# Patient Record
Sex: Male | Born: 1975 | Race: Black or African American | Hispanic: No | Marital: Married | Smoking: Current every day smoker
Health system: Southern US, Community
[De-identification: ages and names within clinical notes are randomized; demographics above are authoritative.]

## PROBLEM LIST (undated history)

## (undated) DIAGNOSIS — F1920 Other psychoactive substance dependence, uncomplicated: Secondary | ICD-10-CM

## (undated) DIAGNOSIS — K859 Acute pancreatitis without necrosis or infection, unspecified: Secondary | ICD-10-CM

## (undated) DIAGNOSIS — K259 Gastric ulcer, unspecified as acute or chronic, without hemorrhage or perforation: Secondary | ICD-10-CM

## (undated) DIAGNOSIS — F1010 Alcohol abuse, uncomplicated: Secondary | ICD-10-CM

---

## 2004-02-28 ENCOUNTER — Other Ambulatory Visit: Payer: Self-pay

## 2004-12-03 ENCOUNTER — Emergency Department: Payer: Self-pay | Admitting: General Practice

## 2008-02-29 ENCOUNTER — Other Ambulatory Visit: Payer: Self-pay

## 2008-02-29 ENCOUNTER — Emergency Department: Payer: Self-pay | Admitting: Internal Medicine

## 2009-01-07 ENCOUNTER — Emergency Department: Payer: Self-pay | Admitting: Unknown Physician Specialty

## 2009-05-02 ENCOUNTER — Emergency Department: Payer: Self-pay | Admitting: Emergency Medicine

## 2009-05-04 ENCOUNTER — Emergency Department: Payer: Self-pay | Admitting: Unknown Physician Specialty

## 2009-05-27 ENCOUNTER — Emergency Department: Payer: Self-pay | Admitting: Internal Medicine

## 2010-02-26 ENCOUNTER — Emergency Department: Payer: Self-pay | Admitting: Unknown Physician Specialty

## 2010-03-20 ENCOUNTER — Emergency Department: Payer: Self-pay | Admitting: Emergency Medicine

## 2010-04-18 ENCOUNTER — Observation Stay: Payer: Self-pay | Admitting: Internal Medicine

## 2012-12-30 ENCOUNTER — Emergency Department: Payer: Self-pay | Admitting: Unknown Physician Specialty

## 2012-12-30 LAB — CBC
HCT: 47.3 % (ref 40.0–52.0)
HGB: 16.2 g/dL (ref 13.0–18.0)
MCH: 32.2 pg (ref 26.0–34.0)
MCHC: 34.2 g/dL (ref 32.0–36.0)
MCV: 94 fL (ref 80–100)
Platelet: 259 10*3/uL (ref 150–440)
RDW: 12.8 % (ref 11.5–14.5)
WBC: 10.6 10*3/uL (ref 3.8–10.6)

## 2012-12-30 LAB — URINALYSIS, COMPLETE
Glucose,UR: NEGATIVE mg/dL (ref 0–75)
Ketone: NEGATIVE
Leukocyte Esterase: NEGATIVE
Nitrite: NEGATIVE
Ph: 5 (ref 4.5–8.0)
Protein: NEGATIVE
RBC,UR: <1 /HPF (ref 0–5)
Specific Gravity: 1.016 (ref 1.003–1.030)
Squamous Epithelial: NONE SEEN
WBC UR: 1 /HPF (ref 0–5)

## 2012-12-30 LAB — COMPREHENSIVE METABOLIC PANEL
Albumin: 3.4 g/dL (ref 3.4–5.0)
Alkaline Phosphatase: 103 U/L (ref 50–136)
Bilirubin,Total: 0.2 mg/dL (ref 0.2–1.0)
Calcium, Total: 8.1 mg/dL — ABNORMAL LOW (ref 8.5–10.1)
Chloride: 110 mmol/L — ABNORMAL HIGH (ref 98–107)
EGFR (African American): >60
EGFR (Non-African Amer.): >60
Glucose: 96 mg/dL (ref 65–99)
Osmolality: 280 (ref 275–301)
SGOT(AST): 26 U/L (ref 15–37)

## 2012-12-30 LAB — DRUG SCREEN, URINE
Amphetamines, Ur Screen: NEGATIVE (ref ?–1000)
Barbiturates, Ur Screen: NEGATIVE (ref ?–200)
Cannabinoid 50 Ng, Ur ~~LOC~~: NEGATIVE (ref ?–50)
MDMA (Ecstasy)Ur Screen: NEGATIVE (ref ?–500)
Opiate, Ur Screen: NEGATIVE (ref ?–300)
Phencyclidine (PCP) Ur S: NEGATIVE (ref ?–25)

## 2012-12-30 LAB — ETHANOL
Ethanol %: 0.139 % — ABNORMAL HIGH (ref 0.000–0.080)
Ethanol: 139 mg/dL

## 2012-12-30 LAB — TSH: Thyroid Stimulating Horm: 0.569 u[IU]/mL

## 2013-11-02 ENCOUNTER — Emergency Department: Payer: Self-pay | Admitting: Emergency Medicine

## 2013-11-10 ENCOUNTER — Emergency Department: Payer: Self-pay | Admitting: Emergency Medicine

## 2013-11-10 LAB — MONONUCLEOSIS SCREEN: Mono Test: NEGATIVE

## 2013-11-10 LAB — CBC
HCT: 47.9 % (ref 40.0–52.0)
HGB: 16.3 g/dL (ref 13.0–18.0)
MCH: 32.5 pg (ref 26.0–34.0)
MCHC: 34.1 g/dL (ref 32.0–36.0)
MCV: 95 fL (ref 80–100)
PLATELETS: 272 10*3/uL (ref 150–440)
RBC: 5.02 10*6/uL (ref 4.40–5.90)
RDW: 13.3 % (ref 11.5–14.5)
WBC: 11.7 10*3/uL — ABNORMAL HIGH (ref 3.8–10.6)

## 2013-11-14 LAB — BETA STREP CULTURE(ARMC)

## 2014-01-02 ENCOUNTER — Emergency Department: Payer: Self-pay | Admitting: Emergency Medicine

## 2014-04-14 ENCOUNTER — Emergency Department: Payer: Self-pay | Admitting: Emergency Medicine

## 2014-10-09 NOTE — Consult Note (Signed)
PATIENT NAME:  Parker ShihAPP, Panayiotis D MR#:  [id number] DATE OF BIRTH:  [birth year]  DATE OF CONSULTATION:  12/30/2012  CONSULTING PHYSICIAN:  Izola PriceFrances C. Jaclynn MajorGreason, MD  CHIEF COMPLAINT: "I need to get off this shit."   HISTORY OF PRESENT ILLNESS: Mr. Parker Thompson reports that came to the ED last night after a bout of using cocaine and drinking alcohol. He says that he has a job, but he gets paid at the end of every day and said he spends all of that on alcohol and crack cocaine. He reports that he generally spends about $100 on crack cocaine and drinks at least 6 40-ounce beers a day. He then goes on and says that maybe he does not drink beer every day and does not drink that much, but he drinks a lot of beer, too. He reports he is "tired of not having anything."   On interview, he is not suicidal or homicidal. There are no delusions. There is no psychosis. There are no signs or symptoms of withdrawal. He is rather demanding and loud.   He, in the course of his stay here, was offered to go to [facility] to enroll in their treatment program; however, he reports that he was there last year and left on his own after 2 days because he did not feel like he was getting much assistance there. He wants us to find him a place to go "for a long time."   ALCOHOL AND DRUG ABUSE: He reports that he first used alcohol when he was approximately [age] or 39 years old. His last use was 12/29/2012, and he reports that he probably drank about 6 40-ounce beers, but he is not sure. He reports that he uses alcohol 3 to 7 days a week. He denies any history of seizures or blackouts.   He reports that he also started using cocaine at about the same time, and his last use of cocaine was on 12/29/2012. He reports that he uses about $100 a day and uses most every day.   MENTAL STATUS EXAM: Mr. Parker Thompson is rather guarded in his presentation, and he also does not answer questions. He is accusatory and blames us for his inability to get what he deems "good care."  He denies any sleeping problems. His mood is variable and irritable and agitated. His thought processes are concrete. His memory appears to be intact, and he denies any auditory or visual hallucinations. There are no delusions. His  behavior is unremarkable. He is oriented x4. His speech is within normal limits. He has minimal insight and poor judgment.   SOCIAL HISTORY: He reports that he lives with his wife in a home, but he is not sure what she is going to do after this most recent bout of drug and alcohol use and spending his money on that. He denies any history of suicide attempts and denies any suicidal ideation, intent or plan presently. He claims that he was in and out of prison and has spent as much as 5 years secondary to drug charges. He denies any probation or troubles with the law. He denies any homicidal ideation or any history of having hurt anyone. He denies any plan to harm anyone.   FAMILY HISTORY: He denies any history of mental illness in his family. He reports that his family is supportive.   MEDICATIONS: None.   ALLERGIES: SHELLFISH, HE HAS RESPIRATORY DISTRESS IN RELATION  TO THAT.   DIAGNOSIS PRINCIPAL AND PRIMARY:  AXIS I:  1.  History of alcohol abuse. 2.  History of cocaine abuse.   AXIS II: Personality disorder, not otherwise specified.   AXIS III: None.   AXIS IV: Moderate.   AXIS V: 55 to 60%.  PLAN: Mr. Tung Pustejovsky Matteucci was offered to go to [facility] for treatment here in [place]; however, he said they were "useless," that he had been there before and after 2 days he left voluntarily. So, he was then offered a referral to [facility] which he accepted.  ____________________________ Izola Price. Jaclynn Major, MD fcg:cb D: 12/30/2012 15:52:10 ET T: 12/30/2012 16:42:31 ET JOB#: [id number]  cc: Izola Price. Jaclynn Major, MD, <Dictator> Maryan Puls MD ELECTRONICALLY SIGNED 01/01/2013 14:33

## 2015-03-01 ENCOUNTER — Emergency Department
Admission: EM | Admit: 2015-03-01 | Discharge: 2015-03-01 | Disposition: A | Discharge status: Home / Self Care | Payer: Self-pay | Attending: Emergency Medicine | Admitting: Emergency Medicine

## 2015-03-01 ENCOUNTER — Emergency Department: Payer: Self-pay

## 2015-03-01 ENCOUNTER — Encounter: Payer: Self-pay | Admitting: Emergency Medicine

## 2015-03-01 DIAGNOSIS — R1013 Epigastric pain: Secondary | ICD-10-CM | POA: Insufficient documentation

## 2015-03-01 DIAGNOSIS — Z720 Tobacco use: Secondary | ICD-10-CM | POA: Insufficient documentation

## 2015-03-01 DIAGNOSIS — R1012 Left upper quadrant pain: Secondary | ICD-10-CM | POA: Insufficient documentation

## 2015-03-01 DIAGNOSIS — R1011 Right upper quadrant pain: Secondary | ICD-10-CM | POA: Insufficient documentation

## 2015-03-01 DIAGNOSIS — R112 Nausea with vomiting, unspecified: Secondary | ICD-10-CM | POA: Insufficient documentation

## 2015-03-01 HISTORY — DX: Acute pancreatitis without necrosis or infection, unspecified: K85.90

## 2015-03-01 HISTORY — DX: Gastric ulcer, unspecified as acute or chronic, without hemorrhage or perforation: K25.9

## 2015-03-01 LAB — CBC WITH DIFFERENTIAL/PLATELET
BASOS ABS: 0.1 10*3/uL (ref 0–0.1)
Basophils Relative: 1 %
Eosinophils Absolute: 0.2 10*3/uL (ref 0–0.7)
Eosinophils Relative: 2 %
HEMATOCRIT: 48.5 % (ref 40.0–52.0)
Hemoglobin: 16.6 g/dL (ref 13.0–18.0)
LYMPHS PCT: 19 %
Lymphs Abs: 1.6 10*3/uL (ref 1.0–3.6)
MCH: 32.9 pg (ref 26.0–34.0)
MCHC: 34.1 g/dL (ref 32.0–36.0)
MCV: 96.5 fL (ref 80.0–100.0)
MONO ABS: 0.6 10*3/uL (ref 0.2–1.0)
Monocytes Relative: 7 %
NEUTROS ABS: 5.8 10*3/uL (ref 1.4–6.5)
NEUTROS PCT: 71 %
Platelets: 277 10*3/uL (ref 150–440)
RBC: 5.03 MIL/uL (ref 4.40–5.90)
RDW: 12.4 % (ref 11.5–14.5)
WBC: 8.3 10*3/uL (ref 3.8–10.6)

## 2015-03-01 LAB — COMPREHENSIVE METABOLIC PANEL
ALBUMIN: 3.7 g/dL (ref 3.5–5.0)
ALT: 16 U/L — AB (ref 17–63)
AST: 22 U/L (ref 15–41)
Alkaline Phosphatase: 78 U/L (ref 38–126)
Anion gap: 5 (ref 5–15)
BILIRUBIN TOTAL: 0.3 mg/dL (ref 0.3–1.2)
BUN: 9 mg/dL (ref 6–20)
CO2: 29 mmol/L (ref 22–32)
Calcium: 9.1 mg/dL (ref 8.9–10.3)
Chloride: 106 mmol/L (ref 101–111)
Creatinine, Ser: 1.25 mg/dL — ABNORMAL HIGH (ref 0.61–1.24)
GFR calc Af Amer: >60 mL/min (ref >60)
GFR calc non Af Amer: >60 mL/min (ref >60)
GLUCOSE: 105 mg/dL — AB (ref 65–99)
Potassium: 4 mmol/L (ref 3.5–5.1)
Sodium: 140 mmol/L (ref 135–145)
Total Protein: 6.7 g/dL (ref 6.5–8.1)

## 2015-03-01 LAB — LIPASE, BLOOD: Lipase: 34 U/L (ref 22–51)

## 2015-03-01 LAB — TROPONIN I

## 2015-03-01 MED ORDER — IOHEXOL 300 MG/ML  SOLN
100.0000 mL | Freq: Once | INTRAMUSCULAR | Status: AC | PRN
  Administered 2015-03-01: 100 mL via INTRAVENOUS
  Filled 2015-03-01: qty 100

## 2015-03-01 MED ORDER — SODIUM CHLORIDE 0.9 % IV BOLUS (SEPSIS)
1000.0000 mL | Freq: Once | INTRAVENOUS | Status: AC
Start: 2015-03-01 — End: 2015-03-01
  Administered 2015-03-01: 1000 mL via INTRAVENOUS

## 2015-03-01 MED ORDER — ONDANSETRON HCL 4 MG/2ML IJ SOLN
4.0000 mg | Freq: Once | INTRAMUSCULAR | Status: AC
  Administered 2015-03-01: 4 mg via INTRAVENOUS

## 2015-03-01 MED ORDER — DICYCLOMINE HCL 20 MG PO TABS
ORAL_TABLET | ORAL | Status: AC
  Administered 2015-03-01: 20 mg
  Filled 2015-03-01: qty 1

## 2015-03-01 MED ORDER — IOHEXOL 240 MG/ML SOLN
25.0000 mL | Freq: Once | INTRAMUSCULAR | Status: AC | PRN
  Administered 2015-03-01: 25 mL via INTRAVENOUS
  Filled 2015-03-01: qty 25

## 2015-03-01 MED ORDER — DICYCLOMINE HCL 10 MG PO CAPS
20.0000 mg | ORAL_CAPSULE | Freq: Once | ORAL | Status: DC
  Filled 2015-03-01: qty 2

## 2015-03-01 MED ORDER — OMEPRAZOLE 40 MG PO CPDR: 40.0000 mg | DELAYED_RELEASE_CAPSULE | Freq: Every day | ORAL | Status: DC

## 2015-03-01 MED ORDER — MORPHINE SULFATE (PF) 4 MG/ML IV SOLN
4.0000 mg | Freq: Once | INTRAVENOUS | Status: AC
Start: 2015-03-01 — End: 2015-03-01
  Administered 2015-03-01: 4 mg via INTRAVENOUS

## 2015-03-01 MED ORDER — DICYCLOMINE HCL 20 MG PO TABS: 20.0000 mg | ORAL_TABLET | Freq: Three times a day (TID) | ORAL | Status: DC | PRN

## 2015-03-01 MED ORDER — MORPHINE SULFATE (PF) 4 MG/ML IV SOLN
INTRAVENOUS | Status: AC
  Administered 2015-03-01: 4 mg via INTRAVENOUS
  Filled 2015-03-01: qty 1

## 2015-03-01 MED ORDER — ONDANSETRON HCL 4 MG/2ML IJ SOLN
INTRAMUSCULAR | Status: AC
  Filled 2015-03-01: qty 2

## 2015-03-01 MED ORDER — GI COCKTAIL ~~LOC~~
ORAL | Status: AC
  Administered 2015-03-01: 30 mL via ORAL
  Filled 2015-03-01: qty 30

## 2015-03-01 MED ORDER — GI COCKTAIL ~~LOC~~
30.0000 mL | Freq: Once | ORAL | Status: AC
  Administered 2015-03-01: 30 mL via ORAL

## 2015-03-01 NOTE — ED Notes (Signed)
Reports vomiting once or twice a day x 2 wks and noticed blood in it.  Reports pain abd all over and all over back pain.  Skin w/d.

## 2015-03-01 NOTE — ED Provider Notes (Addendum)
[facility] Emergency Department Provider Note  ____________________________________________  Time seen: Approximately 345 PM  I have reviewed the triage vital signs and the nursing notes.   HISTORY  Chief Complaint Emesis    HPI Parker Thompson is a 39 y.o. male with a history of pancreatitis and gastric ulcers who is presenting today with 1-2 weeks of nausea, vomiting as well as upper abdominal pain and back pain. He says that he has vomited once or twice per day with a small amount of blood streaks in his vomitus. He says the pain is intermittent and cramping. He has not had any blood in his stool and has had no issues stooling with his last bowel movement being small morning. He says that he has had this issue multiple times in the past. He does have a history of alcoholism and drinking 6-12 beers per day. He also admits to using crack cocaine with his last use being 3 days ago.   Past Medical History  Diagnosis Date  . Gastric ulcer   . Pancreatitis     There are no active problems to display for this patient.   History reviewed. No pertinent past surgical history.  No current outpatient prescriptions on file.  Allergies Shellfish allergy  History reviewed. No pertinent family history.  Social History Social History  Substance Use Topics  . Smoking status: Current Every Day Smoker  . Smokeless tobacco: None  . Alcohol Use: Yes    Review of Systems Constitutional: No fever/chills Eyes: No visual changes. ENT: No sore throat. Cardiovascular: Denies chest pain. Respiratory: Denies shortness of breath. Gastrointestinal: No diarrhea.  No constipation. Genitourinary: Negative for dysuria. Musculoskeletal: Negative for back pain. Skin: Negative for rash. Neurological: Negative for headaches, focal weakness or numbness.  10-point ROS otherwise negative.  ____________________________________________   PHYSICAL EXAM:  VITAL SIGNS: ED  Triage Vitals  Enc Vitals Group     BP 03/01/15 1153 118/83 mmHg     Pulse Rate 03/01/15 1153 77     Resp 03/01/15 1153 18     Temp 03/01/15 1153 98.5 F (36.9 C)     Temp Source 03/01/15 1153 Oral     SpO2 03/01/15 1153 98 %     Weight 03/01/15 1153 170 lb (77.111 kg)     Height 03/01/15 1153 5\' 8"  (1.727 m)     Head Cir --      Peak Flow --      Pain Score --      Pain Loc --      Pain Edu? --      Excl. in GC? --     Constitutional: Alert and oriented. Well appearing and in no acute distress. Eyes: Conjunctivae are normal. PERRL. EOMI. Head: Atraumatic. Nose: No congestion/rhinnorhea. Mouth/Throat: Mucous membranes are moist.  Oropharynx non-erythematous. Neck: No stridor.   Cardiovascular: Normal rate, regular rhythm. Grossly normal heart sounds.  Good peripheral circulation. Respiratory: Normal respiratory effort.  No retractions. Lungs CTAB. Gastrointestinal: Soft with moderate to severe epigastric tenderness. Mild tenderness to the right upper quadrant as well as left upper quadrant. There is a negative Murphy sign.. No distention. No abdominal bruits. No CVA tenderness. Musculoskeletal: No lower extremity tenderness nor edema.  No joint effusions. Neurologic:  Normal speech and language. No gross focal neurologic deficits are appreciated. No gait instability. Skin:  Skin is warm, dry and intact. No rash noted. Psychiatric: Mood and affect are normal. Speech and behavior are normal.  ____________________________________________  LABS (all labs ordered are listed, but only abnormal results are displayed)  Labs Reviewed  COMPREHENSIVE METABOLIC PANEL - Abnormal; Notable for the following:    Glucose, Bld 105 (*)    Creatinine, Ser 1.25 (*)    ALT 16 (*)    All other components within normal limits  CBC WITH DIFFERENTIAL/PLATELET  LIPASE, BLOOD  TROPONIN I   ____________________________________________  EKG ED ECG REPORT I, Arelia Longest, the attending  physician, personally viewed and interpreted this ECG.   Date: 03/01/2015  EKG Time: 1635  Rate: 67  Rhythm: normal sinus rhythm  Axis: Normal axis  Intervals:Incomplete right bundle branch block  ST&T Change: T-wave inversion in V2. Possibly related to lead placement. No ST elevations or depressions. ____________________________________________  RADIOLOGY  No acute pulmonary abnormality on the chest x-ray. A person reviewed these images. Normal study on the CAT scan of the abdomen and pelvis. ____________________________________________   PROCEDURES    ____________________________________________   INITIAL IMPRESSION / ASSESSMENT AND PLAN / ED COURSE  Pertinent labs & imaging results that were available during my care of the patient were reviewed by me and considered in my medical decision making (see chart for details).  ----------------------------------------- 4:38 PM on 03/01/2015 -----------------------------------------  Patient with pain improved after IV morphine. Was able tolerate by mouth contrast.  ----------------------------------------- 7:18 PM on 03/01/2015 ----------------------------------------- Patient requested food tray and ate the entire thing. Has been resting comfortably and not asking for any more dose of pain meds. However, when I went in to tell him that the rest of his lab results came back unremarkable he starts complaining of pain. I reassured him that his labs and imaging did not show any emergent pathology. However, I told him that he would need to follow-up with gastroenterologist and that this could be related to his ulcer pain. He will be discharged with a PPI as well as Bentyl. We discussed that he will likely need an endoscopy in the office.  ____________________________________________   FINAL CLINICAL IMPRESSION(S) / ED DIAGNOSES  Acute epigastric abdominal pain with nausea and vomiting. Initial visit.    Myrna Blazer, MD 03/01/15 1920  We discussed this likely being related to his alcohol consumption.  Myrna Blazer, MD 03/01/15 1922  Pt is PERC negative  Myrna Blazer, MD 03/01/15 [phone]

## 2015-03-01 NOTE — Discharge Instructions (Signed)
Abdominal Pain °Many things can cause abdominal pain. Usually, abdominal pain is not caused by a disease and will improve without treatment. It can often be observed and treated at home. Your health care provider will do a physical exam and possibly order blood tests and X-rays to help determine the seriousness of your pain. However, in many cases, more time must pass before a clear cause of the pain can be found. Before that point, your health care provider may not know if you need more testing or further treatment. °HOME CARE INSTRUCTIONS  °Monitor your abdominal pain for any changes. The following actions may help to alleviate any discomfort you are experiencing: °· Only take over-the-counter or prescription medicines as directed by your health care provider. °· Do not take laxatives unless directed to do so by your health care provider. °· Try a clear liquid diet (broth, tea, or water) as directed by your health care provider. Slowly move to a bland diet as tolerated. °SEEK MEDICAL CARE IF: °· You have unexplained abdominal pain. °· You have abdominal pain associated with nausea or diarrhea. °· You have pain when you urinate or have a bowel movement. °· You experience abdominal pain that wakes you in the night. °· You have abdominal pain that is worsened or improved by eating food. °· You have abdominal pain that is worsened with eating fatty foods. °· You have a fever. °SEEK IMMEDIATE MEDICAL CARE IF:  °· Your pain does not go away within 2 hours. °· You keep throwing up (vomiting). °· Your pain is felt only in portions of the abdomen, such as the right side or the left lower portion of the abdomen. °· You pass bloody or black tarry stools. °MAKE SURE YOU: °· Understand these instructions.   °· Will watch your condition.   °· Will get help right away if you are not doing well or get worse.   °Document Released: 03/15/2005 Document Revised: 06/10/2013 Document Reviewed: 02/12/2013 °ExitCare® Patient Information  ©2015 [employer]. This information is not intended to replace advice given to you by your health care provider. Make sure you discuss any questions you have with your health care provider. ° °Nausea and Vomiting °Nausea is a sick feeling that often comes before throwing up (vomiting). Vomiting is a reflex where stomach contents come out of your mouth. Vomiting can cause severe loss of body fluids (dehydration). Children and elderly adults can become dehydrated quickly, especially if they also have diarrhea. Nausea and vomiting are symptoms of a condition or disease. It is important to find the cause of your symptoms. °CAUSES  °· Direct irritation of the stomach lining. This irritation can result from increased acid production (gastroesophageal reflux disease), infection, food poisoning, taking certain medicines (such as nonsteroidal anti-inflammatory drugs), alcohol use, or tobacco use. °· Signals from the brain. These signals could be caused by a headache, heat exposure, an inner ear disturbance, increased pressure in the brain from injury, infection, a tumor, or a concussion, pain, emotional stimulus, or metabolic problems. °· An obstruction in the gastrointestinal tract (bowel obstruction). °· Illnesses such as diabetes, hepatitis, gallbladder problems, appendicitis, kidney problems, cancer, sepsis, atypical symptoms of a heart attack, or eating disorders. °· Medical treatments such as chemotherapy and radiation. °· Receiving medicine that makes you sleep (general anesthetic) during surgery. °DIAGNOSIS °Your caregiver may ask for tests to be done if the problems do not improve after a few days. Tests may also be done if symptoms are severe or if the reason for the nausea   and vomiting is not clear. Tests may include: °· Urine tests. °· Blood tests. °· Stool tests. °· Cultures (to look for evidence of infection). °· X-rays or other imaging studies. °Test results can help your caregiver make decisions about  treatment or the need for additional tests. °TREATMENT °You need to stay well hydrated. Drink frequently but in small amounts. You may wish to drink water, sports drinks, clear broth, or eat frozen ice pops or gelatin dessert to help stay hydrated. When you eat, eating slowly may help prevent nausea. There are also some antinausea medicines that may help prevent nausea. °HOME CARE INSTRUCTIONS  °· Take all medicine as directed by your caregiver. °· If you do not have an appetite, do not force yourself to eat. However, you must continue to drink fluids. °· If you have an appetite, eat a normal diet unless your caregiver tells you differently. °¨ Eat a variety of complex carbohydrates (rice, wheat, potatoes, bread), lean meats, yogurt, fruits, and vegetables. °¨ Avoid high-fat foods because they are more difficult to digest. °· Drink enough water and fluids to keep your urine clear or pale yellow. °· If you are dehydrated, ask your caregiver for specific rehydration instructions. Signs of dehydration may include: °¨ Severe thirst. °¨ Dry lips and mouth. °¨ Dizziness. °¨ Dark urine. °¨ Decreasing urine frequency and amount. °¨ Confusion. °¨ Rapid breathing or pulse. °SEEK IMMEDIATE MEDICAL CARE IF:  °· You have blood or brown flecks (like coffee grounds) in your vomit. °· You have black or bloody stools. °· You have a severe headache or stiff neck. °· You are confused. °· You have severe abdominal pain. °· You have chest pain or trouble breathing. °· You do not urinate at least once every 8 hours. °· You develop cold or clammy skin. °· You continue to vomit for longer than 24 to 48 hours. °· You have a fever. °MAKE SURE YOU:  °· Understand these instructions. °· Will watch your condition. °· Will get help right away if you are not doing well or get worse. °Document Released: 06/05/2005 Document Revised: 08/28/2011 Document Reviewed: 11/02/2010 °ExitCare® Patient Information ©2015 [employer]. This information is not  intended to replace advice given to you by your health care provider. Make sure you discuss any questions you have with your health care provider. ° °

## 2015-03-01 NOTE — ED Notes (Signed)
Attempted to call lab about Troponin results. Lab did not answer phones. Will call again soon.

## 2015-03-01 NOTE — ED Notes (Signed)
Called lab concerning trop, lab states "We are working on it now", will follow up

## 2015-03-01 NOTE — ED Notes (Signed)
Patient was agitated at discharge, stating he was in pain and he wasn't given anything for pain. Patient states he has been here before and "They say they can't find anything wrong." Patient denies following up with anyone else. Patient ate the ED sandwich tray. Patient refused to have IV site taped down after IV was removed.

## 2015-03-01 NOTE — ED Notes (Signed)
Pt states some nausea and vomiting with blood for the past 2 weeks, pt states lower abd pain around the umbilicus, states he drinks between a 6 pack and 12 pack of beer a night, denies any loose stools

## 2015-09-24 ENCOUNTER — Encounter: Payer: Self-pay | Admitting: Emergency Medicine

## 2015-09-24 ENCOUNTER — Emergency Department: Payer: Self-pay

## 2015-09-24 ENCOUNTER — Emergency Department
Admission: EM | Admit: 2015-09-24 | Discharge: 2015-09-24 | Disposition: A | Discharge status: Home / Self Care | Payer: Self-pay | Attending: Emergency Medicine | Admitting: Emergency Medicine

## 2015-09-24 DIAGNOSIS — Y999 Unspecified external cause status: Secondary | ICD-10-CM | POA: Insufficient documentation

## 2015-09-24 DIAGNOSIS — F17210 Nicotine dependence, cigarettes, uncomplicated: Secondary | ICD-10-CM | POA: Insufficient documentation

## 2015-09-24 DIAGNOSIS — S8391XA Sprain of unspecified site of right knee, initial encounter: Secondary | ICD-10-CM | POA: Insufficient documentation

## 2015-09-24 DIAGNOSIS — S80211A Abrasion, right knee, initial encounter: Secondary | ICD-10-CM

## 2015-09-24 DIAGNOSIS — S8001XA Contusion of right knee, initial encounter: Secondary | ICD-10-CM

## 2015-09-24 DIAGNOSIS — Y9355 Activity, bike riding: Secondary | ICD-10-CM | POA: Insufficient documentation

## 2015-09-24 DIAGNOSIS — Y92410 Unspecified street and highway as the place of occurrence of the external cause: Secondary | ICD-10-CM | POA: Insufficient documentation

## 2015-09-24 DIAGNOSIS — Z79899 Other long term (current) drug therapy: Secondary | ICD-10-CM | POA: Insufficient documentation

## 2015-09-24 DIAGNOSIS — K259 Gastric ulcer, unspecified as acute or chronic, without hemorrhage or perforation: Secondary | ICD-10-CM | POA: Insufficient documentation

## 2015-09-24 MED ORDER — NAPROXEN 500 MG PO TABS: 500.0000 mg | ORAL_TABLET | Freq: Two times a day (BID) | ORAL | Status: DC

## 2015-09-24 MED ORDER — KETOROLAC TROMETHAMINE 30 MG/ML IJ SOLN
30.0000 mg | Freq: Once | INTRAMUSCULAR | Status: AC
  Administered 2015-09-24: 30 mg via INTRAMUSCULAR
  Filled 2015-09-24: qty 1

## 2015-09-24 MED ORDER — HYDROCODONE-ACETAMINOPHEN 5-325 MG PO TABS: 1.0000 | ORAL_TABLET | Freq: Four times a day (QID) | ORAL | Status: DC | PRN
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
## 2015-09-24 NOTE — ED Notes (Signed)
Pt states the right knee is swollen.

## 2015-09-24 NOTE — Discharge Instructions (Signed)
Cryotherapy Cryotherapy is when you put ice on your injury. Ice helps lessen pain and puffiness (swelling) after an injury. Ice works the best when you start using it in the first 24 to 48 hours after an injury. HOME CARE  Put a dry or damp towel between the ice pack and your skin.  You may press gently on the ice pack.  Leave the ice on for no more than 10 to 20 minutes at a time.  Check your skin after 5 minutes to make sure your skin is okay.  Rest at least 20 minutes between ice pack uses.  Stop using ice when your skin loses feeling (numbness).  Do not use ice on someone who cannot tell you when it hurts. This includes small children and people with memory problems (dementia). GET HELP RIGHT AWAY IF:  You have white spots on your skin.  Your skin turns blue or pale.  Your skin feels waxy or hard.  Your puffiness gets worse. MAKE SURE YOU:   Understand these instructions.  Will watch your condition.  Will get help right away if you are not doing well or get worse.   This information is not intended to replace advice given to you by your health care provider. Make sure you discuss any questions you have with your health care provider.   Document Released: 11/22/2007 Document Revised: 08/28/2011 Document Reviewed: 01/26/2011 Elsevier Interactive Patient Education 2016 [employer]  Abrasion An abrasion is a cut or scrape on the surface of your skin. An abrasion does not go through all of the layers of your skin. It is important to take good care of your abrasion to prevent infection. HOME CARE Medicines  Take or apply medicines only as told by your doctor.  If you were prescribed an antibiotic ointment, finish all of it even if you start to feel better. Wound Care  Clean the wound with mild soap and water 2-3 times per day or as told by your doctor. Pat your wound dry with a clean towel. Do not rub it.  There are many ways to close and cover a wound. Follow  instructions from your doctor about:  How to take care of your wound.  When and how you should change your bandage (dressing).  When and how you should take off your dressing.  Check your wound every day for signs of infection. Watch for:  Redness, swelling, or pain.  Fluid, blood, or pus. General Instructions  Keep the dressing dry as told by your doctor. Do not take baths, swim, use a hot tub, or do anything that would put your wound underwater until your doctor says it is okay.  If there is swelling, raise (elevate) the injured area above the level of your heart while you are sitting or lying down.  Keep all follow-up visits as told by your doctor. This is important. GET HELP IF:  You were given a tetanus shot and you have any of these where the needle went in:  Swelling.  Very bad pain.  Redness.  Bleeding.  Medicine does not help your pain.  You have any of these at the site of the wound:  More redness.  More swelling.  More pain. GET HELP RIGHT AWAY IF:  You have a red streak going away from your wound.  You have a fever.  You have fluid, blood, or pus coming from your wound.  There is a bad smell coming from your wound.   This information  is not intended to replace advice given to you by your health care provider. Make sure you discuss any questions you have with your health care provider.   Document Released: 11/22/2007 Document Revised: 10/20/2014 Document Reviewed: 06/03/2014 Elsevier Interactive Patient Education 2016 [employer]   Knee Sprain A knee sprain is a tear in one of the strong, fibrous tissues that connect the bones (ligaments) in your knee. The severity of the sprain depends on how much of the ligament is torn. The tear can be either partial or complete. CAUSES  Often, sprains are a result of a fall or injury. The force of the impact causes the fibers of your ligament to stretch too much. This excess tension causes the fibers of  your ligament to tear. SIGNS AND SYMPTOMS  You may have some loss of motion in your knee. Other symptoms include:  Bruising.  Pain in the knee area.  Tenderness of the knee to the touch.  Swelling. DIAGNOSIS  To diagnose a knee sprain, your health care provider will physically examine your knee. Your health care provider may also suggest an X-ray exam of your knee to make sure no bones are broken. TREATMENT  If your ligament is only partially torn, treatment usually involves keeping the knee in a fixed position (immobilization) or bracing your knee for activities that require movement for several weeks. To do this, your health care provider will apply a bandage, cast, or splint to keep your knee from moving and to support your knee during movement until it heals. For a partially torn ligament, the healing process usually takes 4-6 weeks. If your ligament is completely torn, depending on which ligament it is, you may need surgery to reconnect the ligament to the bone or reconstruct it. After surgery, a cast or splint may be applied and will need to stay on your knee for 4-6 weeks while your ligament heals. HOME CARE INSTRUCTIONS  Keep your injured knee elevated to decrease swelling.  To ease pain and swelling, apply ice to the injured area:  Put ice in a plastic bag.  Place a towel between your skin and the bag.  Leave the ice on for 20 minutes, 2-3 times a day.  Only take medicine for pain as directed by your health care provider.  Do not leave your knee unprotected until pain and stiffness go away (usually 4-6 weeks).  If you have a cast or splint, do not allow it to get wet. If you have been instructed not to remove it, cover it with a plastic bag when you shower or bathe. Do not swim.  Your health care provider may suggest exercises for you to do during your recovery to prevent or limit permanent weakness and stiffness. SEEK IMMEDIATE MEDICAL CARE IF:  Your cast or splint  becomes damaged.  Your pain becomes worse.  You have significant pain, swelling, or numbness below the cast or splint. MAKE SURE YOU:  Understand these instructions.  Will watch your condition.  Will get help right away if you are not doing well or get worse.   This information is not intended to replace advice given to you by your health care provider. Make sure you discuss any questions you have with your health care provider.   Document Released: 06/05/2005 Document Revised: 06/26/2014 Document Reviewed: 01/15/2013 Elsevier Interactive Patient Education [employer]
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
## 2016-08-28 ENCOUNTER — Emergency Department
Admission: EM | Admit: 2016-08-28 | Discharge: 2016-08-28 | Disposition: A | Discharge status: Home / Self Care | Payer: Self-pay | Attending: Emergency Medicine | Admitting: Emergency Medicine

## 2016-08-28 DIAGNOSIS — F17210 Nicotine dependence, cigarettes, uncomplicated: Secondary | ICD-10-CM | POA: Insufficient documentation

## 2016-08-28 DIAGNOSIS — R220 Localized swelling, mass and lump, head: Secondary | ICD-10-CM

## 2016-08-28 DIAGNOSIS — K047 Periapical abscess without sinus: Secondary | ICD-10-CM | POA: Insufficient documentation

## 2016-08-28 MED ORDER — NAPROXEN 500 MG PO TABS: 500.0000 mg | ORAL_TABLET | Freq: Two times a day (BID) | ORAL | 0 refills | Status: DC

## 2016-08-28 MED ORDER — CLINDAMYCIN HCL 150 MG PO CAPS: 450.0000 mg | ORAL_CAPSULE | Freq: Three times a day (TID) | ORAL | 0 refills | Status: DC

## 2016-08-28 NOTE — ED Triage Notes (Signed)
Patient presents with left sided facial swelling. He noticed it yesterday but tonight woke up and it was doubled in size and more painful.

## 2016-08-28 NOTE — ED Provider Notes (Signed)
[facility] Emergency Department Provider Note  ____________________________________________  Time seen: Approximately 3:42 AM  I have reviewed the triage vital signs and the nursing notes.   HISTORY  Chief Complaint Facial Swelling    HPI Parker Thompson is a 41 y.o. male who complains of left-sided facial swelling starting yesterday. He's also had left upper dental pain for the past 2-3 days. No trauma. No fevers chills. No difficulty swallowing or shortness of breath. No neck pain or stiffness. No  vision changes or eye pain.     Past Medical History:  Diagnosis Date  . Gastric ulcer   . Pancreatitis      There are no active problems to display for this patient.    History reviewed. No pertinent surgical history.   Prior to Admission medications   Medication Sig Start Date End Date Taking? Authorizing Provider  clindamycin (CLEOCIN) 150 MG capsule Take 3 capsules (450 mg total) by mouth 3 (three) times daily. 08/28/16   Sharman CheekPhillip Harmon Bommarito, MD  dicyclomine (BENTYL) 20 MG tablet Take 1 tablet (20 mg total) by mouth 3 (three) times daily as needed for spasms. 03/01/15 02/29/16  Myrna Blazeravid Matthew Schaevitz, MD  HYDROcodone-acetaminophen (NORCO) 5-325 MG tablet Take 1 tablet by mouth every 6 (six) hours as needed for severe pain. 09/24/15   Jami L Hagler, PA-C  naproxen (NAPROSYN) 500 MG tablet Take 1 tablet (500 mg total) by mouth 2 (two) times daily with a meal. 08/28/16   Sharman CheekPhillip Mccoy Testa, MD  omeprazole (PRILOSEC) 40 MG capsule Take 1 capsule (40 mg total) by mouth daily. 03/01/15 02/29/16  Myrna Blazeravid Matthew Schaevitz, MD     Allergies Shellfish allergy   No family history on file.  Social History Social History  Substance Use Topics  . Smoking status: Current Every Day Smoker    Packs/day: 0.50    Types: Cigarettes  . Smokeless tobacco: Never Used  . Alcohol use Yes    Review of Systems  Constitutional:   No fever or chills.  ENT:   No sore  throat. No rhinorrhea. Cardiovascular:   No chest pain. Respiratory:   No dyspnea or cough. Gastrointestinal:   Negative for abdominal pain, vomiting and diarrhea.  Genitourinary:   Negative for dysuria or difficulty urinating. Musculoskeletal:   Negative for focal pain or swelling Neurological:   Negative for headaches 10-point ROS otherwise negative.  ____________________________________________   PHYSICAL EXAM:  VITAL SIGNS: ED Triage Vitals  Enc Vitals Group     BP 08/28/16 0147 (!) 147/83     Pulse Rate 08/28/16 0147 86     Resp 08/28/16 0147 20     Temp 08/28/16 0147 98.3 F (36.8 C)     Temp Source 08/28/16 0147 Oral     SpO2 08/28/16 0147 97 %     Weight 08/28/16 0141 170 lb (77.1 kg)     Height 08/28/16 0141 5\' 9"  (1.753 m)     Head Circumference --      Peak Flow --      Pain Score 08/28/16 0141 10     Pain Loc --      Pain Edu? --      Excl. in GC? --     Vital signs reviewed, nursing assessments reviewed.   Constitutional:   Alert and oriented. Well appearing and in no distress. Eyes:   No scleral icterus. No conjunctival pallor. PERRL. EOMI.  No nystagmus. ENT   Head:   Swelling of left maxilla. No bony  point tenderness. No crepitus. No contusions or ecchymosis..   Nose:   No congestion/rhinnorhea. No septal hematoma   Mouth/Throat:   MMM, no pharyngeal erythema. No peritonsillar mass. Poor dentition. Gingivitis left upper mandible without drainage or fluctuant fluid collection   Neck:   No stridor. No SubQ emphysema. No meningismus. Hematological/Lymphatic/Immunilogical:   No cervical lymphadenopathy. Cardiovascular:   RRR. Symmetric bilateral radial and DP pulses.  No murmurs.  Respiratory:   Normal respiratory effort without tachypnea nor retractions. Breath sounds are clear and equal bilaterally. No wheezes/rales/rhonchi. Gastrointestinal:   Soft and nontender. Non distended. There is no CVA tenderness.  No rebound, rigidity, or  guarding.  Musculoskeletal:   Normal range of motion in all extremities Neurologic: . Normal speech and language.  CN 2-10 normal. Motor grossly intact. No gross focal neurologic deficits are appreciated.  ____________________________________________    LABS (pertinent positives/negatives) (all labs ordered are listed, but only abnormal results are displayed) Labs Reviewed - No data to display ____________________________________________   EKG    ____________________________________________    RADIOLOGY  No results found.  ____________________________________________   PROCEDURES Procedures  ____________________________________________   INITIAL IMPRESSION / ASSESSMENT AND PLAN / ED COURSE  Pertinent labs & imaging results that were available during my care of the patient were reviewed by me and considered in my medical decision making (see chart for details).  Patient presents with dental pain and maxillary swelling on the left. Consistent with odontogenic infection. Referred to dentistry. Clindamycin. No evidence of orbital cellulitis and sinusitis brain abscess meningitis encephalitis. No history or evidence of trauma. Suitable for outpatient follow-up. Return precautions given.         ____________________________________________   FINAL CLINICAL IMPRESSION(S) / ED DIAGNOSES  Final diagnoses:  Facial swelling  Dental infection      New Prescriptions   CLINDAMYCIN (CLEOCIN) 150 MG CAPSULE    Take 3 capsules (450 mg total) by mouth 3 (three) times daily.   NAPROXEN (NAPROSYN) 500 MG TABLET    Take 1 tablet (500 mg total) by mouth 2 (two) times daily with a meal.     Portions of this note were generated with dragon dictation software. Dictation errors may occur despite best attempts at proofreading.    Sharman Cheek, MD 08/28/16 [phone]

## 2016-12-16 ENCOUNTER — Emergency Department
Admission: EM | Admit: 2016-12-16 | Discharge: 2016-12-17 | Disposition: A | Discharge status: Rehabilitation Facility | Payer: Self-pay | Attending: Emergency Medicine | Admitting: Emergency Medicine

## 2016-12-16 DIAGNOSIS — F1490 Cocaine use, unspecified, uncomplicated: Secondary | ICD-10-CM | POA: Insufficient documentation

## 2016-12-16 DIAGNOSIS — F17210 Nicotine dependence, cigarettes, uncomplicated: Secondary | ICD-10-CM | POA: Insufficient documentation

## 2016-12-16 DIAGNOSIS — F1020 Alcohol dependence, uncomplicated: Secondary | ICD-10-CM | POA: Insufficient documentation

## 2016-12-16 LAB — CBC
HCT: 50.1 % (ref 40.0–52.0)
Hemoglobin: 17.1 g/dL (ref 13.0–18.0)
MCH: 32.2 pg (ref 26.0–34.0)
MCHC: 34.2 g/dL (ref 32.0–36.0)
MCV: 94.1 fL (ref 80.0–100.0)
Platelets: 267 10*3/uL (ref 150–440)
RBC: 5.32 MIL/uL (ref 4.40–5.90)
RDW: 13.1 % (ref 11.5–14.5)
WBC: 12.1 10*3/uL — AB (ref 3.8–10.6)

## 2016-12-16 LAB — COMPREHENSIVE METABOLIC PANEL
ALK PHOS: 110 U/L (ref 38–126)
ALT: 42 U/L (ref 17–63)
ANION GAP: 10 (ref 5–15)
AST: 47 U/L — ABNORMAL HIGH (ref 15–41)
Albumin: 4.3 g/dL (ref 3.5–5.0)
BILIRUBIN TOTAL: 0.7 mg/dL (ref 0.3–1.2)
BUN: 12 mg/dL (ref 6–20)
CO2: 22 mmol/L (ref 22–32)
Calcium: 9.4 mg/dL (ref 8.9–10.3)
Chloride: 101 mmol/L (ref 101–111)
Creatinine, Ser: 1 mg/dL (ref 0.61–1.24)
GFR calc non Af Amer: >60 mL/min (ref >60)
Glucose, Bld: 76 mg/dL (ref 65–99)
POTASSIUM: 4.3 mmol/L (ref 3.5–5.1)
Sodium: 133 mmol/L — ABNORMAL LOW (ref 135–145)
TOTAL PROTEIN: 8 g/dL (ref 6.5–8.1)

## 2016-12-16 LAB — URINE DRUG SCREEN, QUALITATIVE (ARMC ONLY)
Amphetamines, Ur Screen: NOT DETECTED
BARBITURATES, UR SCREEN: NOT DETECTED
BENZODIAZEPINE, UR SCRN: NOT DETECTED
Cannabinoid 50 Ng, Ur ~~LOC~~: NOT DETECTED
Cocaine Metabolite,Ur ~~LOC~~: POSITIVE — AB
MDMA (Ecstasy)Ur Screen: NOT DETECTED
METHADONE SCREEN, URINE: NOT DETECTED
OPIATE, UR SCREEN: NOT DETECTED
PHENCYCLIDINE (PCP) UR S: NOT DETECTED
Tricyclic, Ur Screen: NOT DETECTED

## 2016-12-16 LAB — ETHANOL: ALCOHOL ETHYL (B): 19 mg/dL — AB (ref <5)

## 2016-12-16 MED ORDER — LORAZEPAM 2 MG/ML IJ SOLN
0.0000 mg | Freq: Four times a day (QID) | INTRAMUSCULAR | Status: DC
  Administered 2016-12-16: 2 mg via INTRAVENOUS

## 2016-12-16 MED ORDER — LORAZEPAM 2 MG PO TABS: 0.0000 mg | ORAL_TABLET | Freq: Two times a day (BID) | ORAL | Status: DC

## 2016-12-16 MED ORDER — THIAMINE HCL 100 MG/ML IJ SOLN: 100.0000 mg | Freq: Every day | INTRAMUSCULAR | Status: DC

## 2016-12-16 MED ORDER — LORAZEPAM 2 MG/ML IJ SOLN: 0.0000 mg | Freq: Two times a day (BID) | INTRAMUSCULAR | Status: DC

## 2016-12-16 MED ORDER — LORAZEPAM 1 MG PO TABS
ORAL_TABLET | ORAL | Status: AC
  Filled 2016-12-16: qty 2

## 2016-12-16 MED ORDER — LORAZEPAM 2 MG PO TABS: 0.0000 mg | ORAL_TABLET | Freq: Four times a day (QID) | ORAL | Status: DC

## 2016-12-16 MED ORDER — VITAMIN B-1 100 MG PO TABS: 100.0000 mg | ORAL_TABLET | Freq: Every day | ORAL | Status: DC

## 2016-12-16 NOTE — BH Assessment (Signed)
Writer discussed with the patient about the option of [facility].Patient was in the agreement with the plan. [facility] [address], [place], [place] [address]. [phone] He was accepted by intake worker Okey RegalCarol and was instructed to enter the front entrance.  The patients Dr and nurse are to be determined when he arrives.

## 2016-12-16 NOTE — ED Triage Notes (Signed)
Pt ambulatory to triage with no difficulty. Pt reports he has been drinking alcohol and using crack cocaine daily for about 6 months. Pt asking for hlep to get off both of these. Pt reports he drinks about 7-40 oz beers a day and has been using between 100 to 200 dollars of crack a day. Pt is calm and cooperative during triage and denies SI/HI, A/VH at this time.

## 2016-12-16 NOTE — ED Notes (Addendum)
Pt wanded for safety by [employer] officer and dressed out by male EMS student Aneta MinsPhillip with this RN present in the room Pt belongings of t shirt, shorts and flip flops placed in belongings bag, labeled and taken to lock up.

## 2016-12-16 NOTE — ED Provider Notes (Signed)
[facility] Emergency Department Provider Note  ____________________________________________   First MD Initiated Contact with Patient 12/16/16 2030     (approximate)  I have reviewed the triage vital signs and the nursing notes.   HISTORY  Chief Complaint Drug / Alcohol Assessment   HPI Parker Thompson is a 41 y.o. male with a history of pancreatitis who is presenting with polysubstance abuse. He says he has been drinking and using cocaine everyday for the past 6 months. However, he says that he is tired and wants help. He says that he has had 2 beers so far today and also has used cocaine today.  Patient says that he smokes cocaine and does not use any IV drugs. Denies any suicidal or homicidal ideation. Denies any audio or visual hallucinations. Patient denies feeling shaky at any time over the past 6 months when not drinking. Does not report any abdominal pain at this time.   Past Medical History:  Diagnosis Date  . Gastric ulcer   . Pancreatitis     There are no active problems to display for this patient.   No past surgical history on file.  Prior to Admission medications   Medication Sig Start Date End Date Taking? Authorizing Provider  clindamycin (CLEOCIN) 150 MG capsule Take 3 capsules (450 mg total) by mouth 3 (three) times daily. 08/28/16   Sharman CheekStafford, Phillip, MD  dicyclomine (BENTYL) 20 MG tablet Take 1 tablet (20 mg total) by mouth 3 (three) times daily as needed for spasms. 03/01/15 02/29/16  Myrna BlazerSchaevitz, Crystallee Werden Matthew, MD  HYDROcodone-acetaminophen (NORCO) 5-325 MG tablet Take 1 tablet by mouth every 6 (six) hours as needed for severe pain. 09/24/15   Hagler, Jami L, PA-C  naproxen (NAPROSYN) 500 MG tablet Take 1 tablet (500 mg total) by mouth 2 (two) times daily with a meal. 08/28/16   Sharman CheekStafford, Phillip, MD  omeprazole (PRILOSEC) 40 MG capsule Take 1 capsule (40 mg total) by mouth daily. 03/01/15 02/29/16  Gursimran Litaker, Myra Rudeavid Matthew, MD     Allergies Shellfish allergy  No family history on file.  Social History Social History  Substance Use Topics  . Smoking status: Current Every Day Smoker    Packs/day: 0.50    Types: Cigarettes  . Smokeless tobacco: Never Used  . Alcohol use Yes    Review of Systems  Constitutional: No fever/chills Eyes: No visual changes. ENT: No sore throat. Cardiovascular: Denies chest pain. Respiratory: Denies shortness of breath. Gastrointestinal: No abdominal pain.  No nausea, no vomiting.  No diarrhea.  No constipation. Genitourinary: Negative for dysuria. Musculoskeletal: Negative for back pain. Skin: Negative for rash. Neurological: Negative for headaches, focal weakness or numbness.   ____________________________________________   PHYSICAL EXAM:  VITAL SIGNS: ED Triage Vitals [12/16/16 1959]  Enc Vitals Group     BP 135/81     Pulse Rate (!) 102     Resp 18     Temp 99 F (37.2 C)     Temp Source Oral     SpO2 97 %     Weight 165 lb (74.8 kg)     Height 5\' 9"  (1.753 m)     Head Circumference      Peak Flow      Pain Score 0     Pain Loc      Pain Edu?      Excl. in GC?     Constitutional: Alert and oriented. Well appearing and in no acute distress. Eyes: Conjunctivae are normal.  Head: Atraumatic. Nose: No congestion/rhinnorhea. Mouth/Throat: Mucous membranes are moist.  Neck: No stridor.   Cardiovascular: Normal rate, regular rhythm. Grossly normal heart sounds.   Respiratory: Normal respiratory effort.  No retractions. Lungs CTAB. Gastrointestinal: Soft and nontender. No distention.  Musculoskeletal: No lower extremity tenderness nor edema.  No joint effusions. Neurologic:  Normal speech and language. No gross focal neurologic deficits are appreciated. Skin:  Skin is warm, dry and intact. No rash noted. Psychiatric: Mood and affect are normal. Speech and behavior are normal.  ____________________________________________   LABS (all labs ordered  are listed, but only abnormal results are displayed)  Labs Reviewed  COMPREHENSIVE METABOLIC PANEL - Abnormal; Notable for the following:       Result Value   Sodium 133 (*)    AST 47 (*)    All other components within normal limits  ETHANOL - Abnormal; Notable for the following:    Alcohol, Ethyl (B) 19 (*)    All other components within normal limits  CBC - Abnormal; Notable for the following:    WBC 12.1 (*)    All other components within normal limits  URINE DRUG SCREEN, QUALITATIVE ([facility] ONLY) - Abnormal; Notable for the following:    Cocaine Metabolite,Ur [facility] POSITIVE (*)    All other components within normal limits   ____________________________________________  EKG   ____________________________________________  RADIOLOGY   ____________________________________________   PROCEDURES  Procedure(s) performed:   Procedures  Critical Care performed:   ____________________________________________   INITIAL IMPRESSION / ASSESSMENT AND PLAN / ED COURSE  Pertinent labs & imaging results that were available during my care of the patient were reviewed by me and considered in my medical decision making (see chart for details).  Patient to be seen by behavioral health intake. We will place the patient on CIWA protocol.      ____________________________________________   FINAL CLINICAL IMPRESSION(S) / ED DIAGNOSES  Alcoholism. Cocaine use.    NEW MEDICATIONS STARTED DURING THIS VISIT:  New Prescriptions   No medications on file     Note:  This document was prepared using Dragon voice recognition software and may include unintentional dictation errors.     Myrna Blazer, MD 12/16/16 2103

## 2016-12-16 NOTE — ED Notes (Signed)
Pt brought in by [employer] officer voluntarily for help with drugs/alcohol; pt calm and cooperative; denies suicidal thoughts

## 2016-12-17 NOTE — ED Notes (Signed)
BEHAVIORAL HEALTH ROUNDING Patient sleeping: Yes.   Patient alert and oriented: not applicable SLEEPING Behavior appropriate: Yes.  ; If no, describe: SLEEPING Nutrition and fluids offered: No SLEEPING Toileting and hygiene offered: NoSLEEPING Sitter present: not applicable, Q 15 min safety rounds and observation. Law enforcement present: Yes ODS 

## 2016-12-17 NOTE — ED Notes (Signed)

## 2016-12-17 NOTE — ED Provider Notes (Signed)
-----------------------------------------   12:14 AM on 12/17/2016 -----------------------------------------  The patient has been accepted to [facility] [place].  I will discharge him so that he can be transported as per our private transportation group.  He is stable and in no acute distress.   Loleta RoseForbach, Naija Troost, MD 12/17/16 [phone]

## 2016-12-17 NOTE — Discharge Instructions (Signed)
You have been seen in the Emergency Department (ED) today for substance abuse.  You have been evaluated by the ED physician(s) and by the behavioral medicine specialists and are being discharged for transportation to [facility].  Please go directly to the facility.    Please return to the ED immediately if you have ANY thoughts of hurting yourself or anyone else, so that we may help you.  Please avoid alcohol and drug use.  Follow up with your doctor and/or therapist as soon as possible regarding today's ED  visit.   Please follow up any other recommendations and clinic appointments provided by the psychiatry team that saw you in the Emergency Department.

## 2017-07-13 ENCOUNTER — Other Ambulatory Visit: Payer: Self-pay

## 2017-07-13 ENCOUNTER — Emergency Department
Admission: EM | Admit: 2017-07-13 | Discharge: 2017-07-13 | Disposition: A | Discharge status: Home / Self Care | Payer: No Typology Code available for payment source | Attending: Emergency Medicine | Admitting: Emergency Medicine

## 2017-07-13 ENCOUNTER — Emergency Department: Payer: No Typology Code available for payment source

## 2017-07-13 ENCOUNTER — Encounter: Payer: Self-pay | Admitting: Emergency Medicine

## 2017-07-13 DIAGNOSIS — Z91013 Allergy to seafood: Secondary | ICD-10-CM | POA: Insufficient documentation

## 2017-07-13 DIAGNOSIS — Z79899 Other long term (current) drug therapy: Secondary | ICD-10-CM | POA: Diagnosis not present

## 2017-07-13 DIAGNOSIS — M542 Cervicalgia: Secondary | ICD-10-CM | POA: Insufficient documentation

## 2017-07-13 DIAGNOSIS — M549 Dorsalgia, unspecified: Secondary | ICD-10-CM | POA: Insufficient documentation

## 2017-07-13 DIAGNOSIS — F17210 Nicotine dependence, cigarettes, uncomplicated: Secondary | ICD-10-CM | POA: Diagnosis not present

## 2017-07-13 MED ORDER — CYCLOBENZAPRINE HCL 10 MG PO TABS: 10.0000 mg | ORAL_TABLET | Freq: Three times a day (TID) | ORAL | 0 refills | Status: AC | PRN

## 2017-07-13 MED ORDER — KETOROLAC TROMETHAMINE 30 MG/ML IJ SOLN
30.0000 mg | Freq: Once | INTRAMUSCULAR | Status: AC
  Administered 2017-07-13: 30 mg via INTRAMUSCULAR
  Filled 2017-07-13: qty 1

## 2017-07-13 MED ORDER — CYCLOBENZAPRINE HCL 10 MG PO TABS
10.0000 mg | ORAL_TABLET | Freq: Once | ORAL | Status: AC
  Administered 2017-07-13: 10 mg via ORAL
  Filled 2017-07-13: qty 1

## 2017-07-13 MED ORDER — MELOXICAM 15 MG PO TABS: 15.0000 mg | ORAL_TABLET | Freq: Every day | ORAL | 1 refills | Status: AC
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
## 2017-08-05 ENCOUNTER — Other Ambulatory Visit: Payer: Self-pay

## 2017-08-05 ENCOUNTER — Encounter: Payer: Self-pay | Admitting: Emergency Medicine

## 2017-08-05 ENCOUNTER — Emergency Department
Admission: EM | Admit: 2017-08-05 | Discharge: 2017-08-05 | Payer: Self-pay | Attending: Emergency Medicine | Admitting: Emergency Medicine

## 2017-08-05 DIAGNOSIS — F17210 Nicotine dependence, cigarettes, uncomplicated: Secondary | ICD-10-CM | POA: Insufficient documentation

## 2017-08-05 DIAGNOSIS — F1910 Other psychoactive substance abuse, uncomplicated: Secondary | ICD-10-CM | POA: Insufficient documentation

## 2017-08-05 DIAGNOSIS — F1010 Alcohol abuse, uncomplicated: Secondary | ICD-10-CM | POA: Insufficient documentation

## 2017-08-05 HISTORY — DX: Alcohol abuse, uncomplicated: F10.10

## 2017-08-05 HISTORY — DX: Other psychoactive substance dependence, uncomplicated: F19.20

## 2017-08-05 LAB — CBC WITH DIFFERENTIAL/PLATELET
BASOS ABS: 0 10*3/uL (ref 0–0.1)
BASOS PCT: 1 %
EOS ABS: 0.2 10*3/uL (ref 0–0.7)
Eosinophils Relative: 2 %
HEMATOCRIT: 49.8 % (ref 40.0–52.0)
HEMOGLOBIN: 17 g/dL (ref 13.0–18.0)
Lymphocytes Relative: 16 %
Lymphs Abs: 1.3 10*3/uL (ref 1.0–3.6)
MCH: 33.1 pg (ref 26.0–34.0)
MCHC: 34.1 g/dL (ref 32.0–36.0)
MCV: 96.9 fL (ref 80.0–100.0)
MONOS PCT: 8 %
Monocytes Absolute: 0.7 10*3/uL (ref 0.2–1.0)
NEUTROS ABS: 6.2 10*3/uL (ref 1.4–6.5)
NEUTROS PCT: 73 %
Platelets: 281 10*3/uL (ref 150–440)
RBC: 5.14 MIL/uL (ref 4.40–5.90)
RDW: 13.2 % (ref 11.5–14.5)
WBC: 8.5 10*3/uL (ref 3.8–10.6)

## 2017-08-05 LAB — URINE DRUG SCREEN, QUALITATIVE (ARMC ONLY)
AMPHETAMINES, UR SCREEN: NOT DETECTED
BARBITURATES, UR SCREEN: NOT DETECTED
Benzodiazepine, Ur Scrn: NOT DETECTED
COCAINE METABOLITE, UR ~~LOC~~: POSITIVE — AB
Cannabinoid 50 Ng, Ur ~~LOC~~: NOT DETECTED
MDMA (ECSTASY) UR SCREEN: NOT DETECTED
METHADONE SCREEN, URINE: NOT DETECTED
OPIATE, UR SCREEN: NOT DETECTED
Phencyclidine (PCP) Ur S: NOT DETECTED
TRICYCLIC, UR SCREEN: NOT DETECTED

## 2017-08-05 LAB — COMPREHENSIVE METABOLIC PANEL
ALT: 37 U/L (ref 17–63)
ANION GAP: 9 (ref 5–15)
AST: 52 U/L — ABNORMAL HIGH (ref 15–41)
Albumin: 3.9 g/dL (ref 3.5–5.0)
Alkaline Phosphatase: 84 U/L (ref 38–126)
BUN: 9 mg/dL (ref 6–20)
CALCIUM: 8.8 mg/dL — AB (ref 8.9–10.3)
CHLORIDE: 105 mmol/L (ref 101–111)
CO2: 23 mmol/L (ref 22–32)
CREATININE: 1.08 mg/dL (ref 0.61–1.24)
Glucose, Bld: 112 mg/dL — ABNORMAL HIGH (ref 65–99)
Potassium: 4.4 mmol/L (ref 3.5–5.1)
SODIUM: 137 mmol/L (ref 135–145)
Total Bilirubin: 0.9 mg/dL (ref 0.3–1.2)
Total Protein: 7.3 g/dL (ref 6.5–8.1)

## 2017-08-05 LAB — SALICYLATE LEVEL

## 2017-08-05 LAB — ACETAMINOPHEN LEVEL

## 2017-08-05 LAB — ETHANOL: Alcohol, Ethyl (B): <10 mg/dL (ref <10)

## 2017-08-05 NOTE — ED Notes (Signed)
[name] in with pt now

## 2017-08-05 NOTE — ED Provider Notes (Signed)
[facility] Emergency Department Provider Note   ____________________________________________   First MD Initiated Contact with Patient 08/05/17 1000     (approximate)  I have reviewed the triage vital signs and the nursing notes.   HISTORY  Chief Complaint Other (Detox)    HPI Parker Thompson is a 42 y.o. male patient reports he does alcohol and cocaine. He asked for detox. He says he doesn't want to go to [facility] because his right middle of the drug area and he does walk up the door and get cocaine. Last alcohol use was before he came in. He denies any other medical problems or complaints at this point.   Past Medical History:  Diagnosis Date  . Drug abuse and dependence (HCC)   . ETOH abuse   . Gastric ulcer   . Pancreatitis     There are no active problems to display for this patient.   History reviewed. No pertinent surgical history.  Prior to Admission medications   Medication Sig Start Date End Date Taking? Authorizing Provider  dicyclomine (BENTYL) 20 MG tablet Take 1 tablet (20 mg total) by mouth 3 (three) times daily as needed for spasms. Patient not taking: Reported on 08/05/2017 03/01/15 02/29/16  Myrna Blazer, MD  HYDROcodone-acetaminophen (NORCO) 5-325 MG tablet Take 1 tablet by mouth every 6 (six) hours as needed for severe pain. Patient not taking: Reported on 08/05/2017 09/24/15   Hagler, Jami L, PA-C  naproxen (NAPROSYN) 500 MG tablet Take 1 tablet (500 mg total) by mouth 2 (two) times daily with a meal. Patient not taking: Reported on 08/05/2017 08/28/16   Sharman Cheek, MD  omeprazole (PRILOSEC) 40 MG capsule Take 1 capsule (40 mg total) by mouth daily. Patient not taking: Reported on 08/05/2017 03/01/15 02/29/16  Schaevitz, Myra Rude, MD    Allergies Shellfish allergy  History reviewed. No pertinent family history.  Social History Social History   Tobacco Use  . Smoking status: Current Every Day Smoker   Packs/day: 0.50    Types: Cigarettes  . Smokeless tobacco: Never Used  Substance Use Topics  . Alcohol use: Yes  . Drug use: Yes    Types: Cocaine    review of systems Constitutional: No fever/chills Eyes: No visual changes. ENT: No sore throat. Cardiovascular: Denies chest pain. Respiratory: Denies shortness of breath. Gastrointestinal: No abdominal pain.  No nausea, no vomiting.  No diarrhea.  No constipation. Genitourinary: Negative for dysuria. Musculoskeletal: Negative for back pain. Skin: Negative for rash. Neurological: Negative for headaches, focal weakness or   ____________________________________________   PHYSICAL EXAM:  VITAL SIGNS: ED Triage Vitals  Enc Vitals Group     BP 08/05/17 0956 121/79     Pulse Rate 08/05/17 0956 90     Resp 08/05/17 0956 16     Temp 08/05/17 0956 98.8 F (37.1 C)     Temp Source 08/05/17 0956 Oral     SpO2 08/05/17 0956 95 %     Weight 08/05/17 0957 175 lb (79.4 kg)     Height 08/05/17 0957 5\' 9"  (1.753 m)     Head Circumference --      Peak Flow --      Pain Score 08/05/17 0957 0     Pain Loc --      Pain Edu? --      Excl. in GC? --     Constitutional: Alert and oriented. Well appearing and in no acute distress. Eyes: Conjunctivae are normal.  Head:  Atraumatic. Nose: No congestion/rhinnorhea. Mouth/Throat: Mucous membranes are moist.  Oropharynx non-erythematous. Neck: No stridor. Cardiovascular: Normal rate, regular rhythm. Grossly normal heart sounds.  Good peripheral circulation. Respiratory: Normal respiratory effort.  No retractions. Lungs CTAB. Gastrointestinal: Soft and nontender. No distention. No abdominal bruits. No CVA tenderness. {Musculoskeletal: No lower extremity tenderness nor edema.  No joint effusions. Neurologic:  Normal speech and language. No gross focal neurologic deficits are appreciated.    ____________________________________________   LABS (all labs ordered are listed, but only abnormal  results are displayed)  Labs Reviewed  COMPREHENSIVE METABOLIC PANEL - Abnormal; Notable for the following components:      Result Value   Glucose, Bld 112 (*)    Calcium 8.8 (*)    AST 52 (*)    All other components within normal limits  ACETAMINOPHEN LEVEL - Abnormal; Notable for the following components:   Acetaminophen (Tylenol), Serum <10 (*)    All other components within normal limits  URINE DRUG SCREEN, QUALITATIVE ([facility] ONLY) - Abnormal; Notable for the following components:   Cocaine Metabolite,[place] POSITIVE (*)    All other components within normal limits  SALICYLATE LEVEL  ETHANOL  CBC WITH DIFFERENTIAL/PLATELET   ____________________________________________  EKG   ____________________________________________  RADIOLOGY  ED MD interpretation:   Official radiology report(s): No results found.  ____________________________________________   PROCEDURES  Procedure(s) performed:   Procedures  Critical Care performed:  ____________________________________________   INITIAL IMPRESSION / ASSESSMENT AND PLAN / ED COURSE  we are attempting to find placement for this patient. He is not exhibiting any signs of withdrawal when I see him.         ____________________________________________   FINAL CLINICAL IMPRESSION(S) / ED DIAGNOSES  Final diagnoses:  Substance abuse [facility](HCC)     ED Discharge Orders    None       Note:  This document was prepared using Dragon voice recognition software and may include unintentional dictation errors.    Arnaldo NatalMalinda, Deaunna Olarte F, MD 08/05/17 [phone]

## 2017-08-05 NOTE — ED Notes (Signed)
[place] [facility]. Pt here for detox - asking for a place in [place]. Wants detox from crack and etoh. ciwas 1 on scale. Pt resting comfortably in his room. Ate a lunch tray.

## 2017-08-05 NOTE — ED Notes (Signed)
Pt given another food tray. Asking for iv to be taken out so was.

## 2017-08-05 NOTE — BH Assessment (Signed)
Assessment Note  Parker Thompson is an 42 y.o. male who presents to the ER seeking assistance for his substance use. Patient states he abuses alcohol, cocaine and cannabis. He states, for the last several months he has used on a daily basis. Due to his use, his relationships have become strained and problematic. Patient denies having a history of violence and aggression. He also denies any involvement with the DSS. He also denies having any SI/HI and AV/H.   Diagnosis: Alcohol, Cocaine & Cannabis Use Disorder; Severe   Past Medical History:  Past Medical History:  Diagnosis Date  . Drug abuse and dependence (HCC)   . ETOH abuse   . Gastric ulcer   . Pancreatitis     History reviewed. No pertinent surgical history.  Family History: History reviewed. No pertinent family history.  Social History:  reports that he has been smoking cigarettes.  He has been smoking about 0.50 packs per day. he has never used smokeless tobacco. He reports that he drinks alcohol. He reports that he uses drugs. Drug: Cocaine.  Additional Social History:  Alcohol / Drug Use Pain Medications: See PTA Prescriptions: See PTA Over the Counter: See PTA History of alcohol / drug use?: Yes Longest period of sobriety (when/how long): Unable to quantify Negative Consequences of Use: Personal relationships Withdrawal Symptoms: Nausea / Vomiting, Sweats, Tremors Substance #1 Name of Substance 1: Cocaine 1 - Age of First Use: 16 1 - Amount (size/oz): $100 1 - Frequency: Daily 1 - Duration: "Like this for two months" 1 - Last Use / Amount: 08/05/2017 Substance #2 Name of Substance 2: Alcohol 2 - Age of First Use: 7 2 - Amount (size/oz): "12-24 beers" (12oz) 2 - Frequency: Daily  2 - Duration: "This for two months" 2 - Last Use / Amount: 08/04/2017 Substance #3 Name of Substance 3: Cannabis 3 - Age of First Use: 15 3 - Amount (size/oz): Unable to quantify  3 - Frequency: "One to two times" 3 - Duration: Unable  to quantify 3 - Last Use / Amount: 08/03/2017  CIWA: CIWA-Ar BP: 121/79 Pulse Rate: 90 Nausea and Vomiting: no nausea and no vomiting Tactile Disturbances: none Tremor: no tremor Auditory Disturbances: not present Paroxysmal Sweats: no sweat visible Visual Disturbances: not present Anxiety: no anxiety, at ease Headache, Fullness in Head: very mild Agitation: normal activity Orientation and Clouding of Sensorium: oriented and can do serial additions CIWA-Ar Total: 1 COWS:    Allergies:  Allergies  Allergen Reactions  . Shellfish Allergy Swelling    Home Medications:  (Not in a hospital admission)  OB/GYN Status:  No LMP for male patient.  General Assessment Data Location of Assessment: [facility] ED TTS Assessment: In system Is this a Tele or Face-to-Face Assessment?: Face-to-Face Is this an Initial Assessment or a Re-assessment for this encounter?: Initial Assessment Marital status: Single Maiden name: n/a Is patient pregnant?: No Pregnancy Status: No Living Arrangements: Spouse/significant other Can pt return to current living arrangement?: Yes Admission Status: Voluntary Is patient capable of signing voluntary admission?: Yes Referral Source: Self/Family/Friend Insurance type: None  Medical Screening Exam [facility] Walk-in ONLY) Medical Exam completed: Yes  Crisis Care Plan Living Arrangements: Spouse/significant other Legal Guardian: Other:(Self) Name of Psychiatrist: Reports of none Name of Therapist: Reports of none  Education Status Is patient currently in school?: No Current Grade: n/a Highest grade of school patient has completed: 9th Grade Name of school: n/a Contact person: n/a  Risk to self with the past 6 months Suicidal  Ideation: No Has patient been a risk to self within the past 6 months prior to admission? : No Suicidal Intent: No Has patient had any suicidal intent within the past 6 months prior to admission? : No Is patient at risk for  suicide?: No Suicidal Plan?: No Has patient had any suicidal plan within the past 6 months prior to admission? : No Access to Means: No What has been your use of drugs/alcohol within the last 12 months?: Alcohol, Cocaine & Alcohol Previous Attempts/Gestures: No How many times?: 0 Other Self Harm Risks: Active Addiction Triggers for Past Attempts: None known Intentional Self Injurious Behavior: None Family Suicide History: No Recent stressful life event(s): Other (Comment) Persecutory voices/beliefs?: No Depression: Yes Depression Symptoms: Tearfulness, Guilt, Feeling worthless/self pity Substance abuse history and/or treatment for substance abuse?: Yes Suicide prevention information given to non-admitted patients: Not applicable  Risk to Others within the past 6 months Homicidal Ideation: No Does patient have any lifetime risk of violence toward others beyond the six months prior to admission? : No Thoughts of Harm to Others: No Current Homicidal Intent: No Current Homicidal Plan: No Access to Homicidal Means: No Identified Victim: Reports of none History of harm to others?: No Assessment of Violence: None Noted Violent Behavior Description: Reports of none Does patient have access to weapons?: No Criminal Charges Pending?: No Does patient have a court date: No Is patient on probation?: No  Psychosis Hallucinations: None noted Delusions: None noted  Mental Status Report Appearance/Hygiene: Unremarkable Eye Contact: Good Speech: Logical/coherent, Unremarkable Level of Consciousness: Alert Mood: Guilty, Sad, Pleasant Affect: Appropriate to circumstance, Sad Anxiety Level: None Thought Processes: Coherent, Relevant Judgement: Unimpaired Orientation: Person, Place, Time, Situation, Appropriate for developmental age Obsessive Compulsive Thoughts/Behaviors: Minimal  Cognitive Functioning Concentration: Normal Memory: Recent Intact, Remote Intact IQ: Average Insight:  Fair Impulse Control: Fair Appetite: Good Weight Loss: 0 Weight Gain: 0 Sleep: No Change Total Hours of Sleep: 7 Vegetative Symptoms: None  ADLScreening [facility] Assessment Services) Patient's cognitive ability adequate to safely complete daily activities?: Yes Patient able to express need for assistance with ADLs?: Yes Independently performs ADLs?: Yes (appropriate for developmental age)  Prior Inpatient Therapy Prior Inpatient Therapy: Yes Prior Therapy Dates: 2017 Prior Therapy Facilty/Provider(s): [facility] Reason for Treatment: Substance Abuse Treatment  Prior Outpatient Therapy Prior Outpatient Therapy: No Prior Therapy Dates: Reports of none Prior Therapy Facilty/Provider(s): Reports of none Reason for Treatment: Reports of none Does patient have an ACCT team?: No Does patient have Intensive In-House Services?  : No Does patient have [facility] services? : No Does patient have P4CC services?: No  ADL Screening (condition at time of admission) Patient's cognitive ability adequate to safely complete daily activities?: Yes Is the patient deaf or have difficulty hearing?: No Does the patient have difficulty seeing, even when wearing glasses/contacts?: No Does the patient have difficulty concentrating, remembering, or making decisions?: No Patient able to express need for assistance with ADLs?: Yes Does the patient have difficulty dressing or bathing?: No Independently performs ADLs?: Yes (appropriate for developmental age) Does the patient have difficulty walking or climbing stairs?: No Weakness of Legs: None Weakness of Arms/Hands: None  Home Assistive Devices/Equipment Home Assistive Devices/Equipment: None  Therapy Consults (therapy consults require a physician order) PT Evaluation Needed: No OT Evalulation Needed: No SLP Evaluation Needed: No Abuse/Neglect Assessment (Assessment to be complete while patient is alone) Abuse/Neglect Assessment Can Be Completed:  Yes Physical Abuse: Denies Verbal Abuse: Denies Sexual Abuse: Denies Exploitation of patient/patient's resources:  Denies Self-Neglect: Denies Values / Beliefs Cultural Requests During Hospitalization: None Spiritual Requests During Hospitalization: None Consults Spiritual Care Consult Needed: No Social Work Consult Needed: No Merchant navy officerAdvance Directives (For Healthcare) Does Patient Have a Medical Advance Directive?: No Would patient like information on creating a medical advance directive?: No - Patient declined    Additional Information 1:1 In Past 12 Months?: No CIRT Risk: No Elopement Risk: No Does patient have medical clearance?: Yes  Child/Adolescent Assessment Running Away Risk: Denies(Patient is an adult)  Disposition:  Disposition Initial Assessment Completed for this Encounter: Yes Disposition of Patient: Referred to Was patient referred to Social Work for Placement?: Yes Patient referred to: Other (Comment)(Detox Facility)  On Site Evaluation by:   Reviewed with Physician:    Lilyan Gilfordalvin J. Lenvil Swaim MS, LCAS, LPC, NCC, CCSI Therapeutic Triage Specialist 08/05/2017 2:03 PM

## 2017-08-05 NOTE — BH Assessment (Addendum)
Per the request of patient, writer faxed information to [facility]. Confirmed it was received Parker Thompson(Parker Thompson).  Writer spoke with [facility] [phone](Parker Thompson-[phone]) and patient accepted to go to transfer to their facility for detox.  ____________________________________ Patient has been accepted to [facility] Accepting physician is Dr. Sanjuana KavaPaula Sherman.  Call report to [phone].  Representative was [employer].   ER Staff is aware of it:  French Anaracy, ER Sect.;  Dr. Roxan Hockeyobinson, ER MD     Address: [facility] [address] [place], [place] [id number]

## 2017-08-05 NOTE — ED Triage Notes (Signed)
Pt in via POV, states he has a "drug and alcohol problem."  Pt reports using crack and drinking alcohol, last use last night.  Pt denies any SI/HI.  Vitals WDL, NAD noted at this time.

## 2017-08-05 NOTE — ED Notes (Signed)
EMTALA reviewed. 

## 2017-08-05 NOTE — ED Notes (Signed)
Patient given meal tray and beverage. 

## 2018-02-28 ENCOUNTER — Emergency Department: Payer: Self-pay

## 2018-02-28 ENCOUNTER — Inpatient Hospital Stay
Admission: EM | Admit: 2018-02-28 | Discharge: 2018-03-01 | DRG: 065 | Disposition: A | Discharge status: Home / Self Care | Payer: Self-pay | Attending: Specialist | Admitting: Specialist

## 2018-02-28 ENCOUNTER — Other Ambulatory Visit: Payer: Self-pay

## 2018-02-28 DIAGNOSIS — G8191 Hemiplegia, unspecified affecting right dominant side: Secondary | ICD-10-CM | POA: Diagnosis present

## 2018-02-28 DIAGNOSIS — F1490 Cocaine use, unspecified, uncomplicated: Secondary | ICD-10-CM | POA: Diagnosis present

## 2018-02-28 DIAGNOSIS — Z9889 Other specified postprocedural states: Secondary | ICD-10-CM

## 2018-02-28 DIAGNOSIS — I639 Cerebral infarction, unspecified: Secondary | ICD-10-CM | POA: Diagnosis present

## 2018-02-28 DIAGNOSIS — I6340 Cerebral infarction due to embolism of unspecified cerebral artery: Principal | ICD-10-CM | POA: Diagnosis present

## 2018-02-28 DIAGNOSIS — Z79899 Other long term (current) drug therapy: Secondary | ICD-10-CM

## 2018-02-28 DIAGNOSIS — F1020 Alcohol dependence, uncomplicated: Secondary | ICD-10-CM | POA: Diagnosis present

## 2018-02-28 DIAGNOSIS — E785 Hyperlipidemia, unspecified: Secondary | ICD-10-CM | POA: Diagnosis present

## 2018-02-28 DIAGNOSIS — Z7141 Alcohol abuse counseling and surveillance of alcoholic: Secondary | ICD-10-CM

## 2018-02-28 DIAGNOSIS — F17210 Nicotine dependence, cigarettes, uncomplicated: Secondary | ICD-10-CM | POA: Diagnosis present

## 2018-02-28 DIAGNOSIS — Z716 Tobacco abuse counseling: Secondary | ICD-10-CM

## 2018-02-28 DIAGNOSIS — Z8711 Personal history of peptic ulcer disease: Secondary | ICD-10-CM

## 2018-02-28 DIAGNOSIS — Z91013 Allergy to seafood: Secondary | ICD-10-CM

## 2018-02-28 LAB — COMPREHENSIVE METABOLIC PANEL
ALBUMIN: 4 g/dL (ref 3.5–5.0)
ALK PHOS: 85 U/L (ref 38–126)
ALT: 28 U/L (ref 0–44)
AST: 33 U/L (ref 15–41)
Anion gap: 8 (ref 5–15)
BUN: 9 mg/dL (ref 6–20)
CHLORIDE: 105 mmol/L (ref 98–111)
CO2: 23 mmol/L (ref 22–32)
Calcium: 8.9 mg/dL (ref 8.9–10.3)
Creatinine, Ser: 0.97 mg/dL (ref 0.61–1.24)
GFR calc Af Amer: >60 mL/min (ref >60)
GFR calc non Af Amer: >60 mL/min (ref >60)
Glucose, Bld: 91 mg/dL (ref 70–99)
POTASSIUM: 3.9 mmol/L (ref 3.5–5.1)
SODIUM: 136 mmol/L (ref 135–145)
TOTAL PROTEIN: 7.2 g/dL (ref 6.5–8.1)
Total Bilirubin: 0.4 mg/dL (ref 0.3–1.2)

## 2018-02-28 LAB — CBC
HCT: 48.1 % (ref 40.0–52.0)
Hemoglobin: 16.9 g/dL (ref 13.0–18.0)
MCH: 33.4 pg (ref 26.0–34.0)
MCHC: 35.1 g/dL (ref 32.0–36.0)
MCV: 95.2 fL (ref 80.0–100.0)
PLATELETS: 290 10*3/uL (ref 150–440)
RBC: 5.06 MIL/uL (ref 4.40–5.90)
RDW: 15 % — AB (ref 11.5–14.5)
WBC: 10.1 10*3/uL (ref 3.8–10.6)

## 2018-02-28 LAB — PROTIME-INR
INR: 0.88
PROTHROMBIN TIME: 11.9 s (ref 11.4–15.2)

## 2018-02-28 LAB — DIFFERENTIAL
BASOS PCT: 1 %
Basophils Absolute: 0.1 10*3/uL (ref 0–0.1)
EOS PCT: 2 %
Eosinophils Absolute: 0.2 10*3/uL (ref 0–0.7)
LYMPHS PCT: 21 %
Lymphs Abs: 2.1 10*3/uL (ref 1.0–3.6)
Monocytes Absolute: 0.6 10*3/uL (ref 0.2–1.0)
Monocytes Relative: 6 %
NEUTROS ABS: 7.1 10*3/uL — AB (ref 1.4–6.5)
NEUTROS PCT: 70 %

## 2018-02-28 LAB — APTT: aPTT: 25 seconds (ref 24–36)

## 2018-02-28 LAB — TROPONIN I

## 2018-02-28 MED ORDER — LORAZEPAM 1 MG PO TABS
0.0000 mg | ORAL_TABLET | Freq: Four times a day (QID) | ORAL | Status: DC
  Administered 2018-03-01: 1 mg via ORAL
  Filled 2018-02-28: qty 4
  Filled 2018-02-28: qty 1

## 2018-02-28 MED ORDER — LORAZEPAM 2 MG/ML IJ SOLN
1.0000 mg | Freq: Four times a day (QID) | INTRAMUSCULAR | Status: DC | PRN
  Administered 2018-03-01: 01:00:00 1 mg via INTRAVENOUS
  Filled 2018-02-28: qty 1

## 2018-02-28 MED ORDER — ENOXAPARIN SODIUM 40 MG/0.4ML ~~LOC~~ SOLN
40.0000 mg | SUBCUTANEOUS | Status: DC
  Administered 2018-02-28: 22:00:00 40 mg via SUBCUTANEOUS
  Filled 2018-02-28: qty 0.4

## 2018-02-28 MED ORDER — SENNOSIDES-DOCUSATE SODIUM 8.6-50 MG PO TABS: 1.0000 | ORAL_TABLET | Freq: Every evening | ORAL | Status: DC | PRN

## 2018-02-28 MED ORDER — ACETAMINOPHEN 650 MG RE SUPP: 650.0000 mg | RECTAL | Status: DC | PRN

## 2018-02-28 MED ORDER — ASPIRIN 81 MG PO CHEW
324.0000 mg | CHEWABLE_TABLET | Freq: Once | ORAL | Status: AC
  Administered 2018-02-28: 324 mg via ORAL
  Filled 2018-02-28: qty 4

## 2018-02-28 MED ORDER — THIAMINE HCL 100 MG/ML IJ SOLN
100.0000 mg | Freq: Every day | INTRAMUSCULAR | Status: DC
  Filled 2018-02-28 (×3): qty 1

## 2018-02-28 MED ORDER — ACETAMINOPHEN 160 MG/5ML PO SOLN
650.0000 mg | ORAL | Status: DC | PRN
  Filled 2018-02-28: qty 20.3

## 2018-02-28 MED ORDER — NICOTINE 21 MG/24HR TD PT24
21.0000 mg | MEDICATED_PATCH | Freq: Every day | TRANSDERMAL | Status: DC
  Filled 2018-02-28: qty 1

## 2018-02-28 MED ORDER — ADULT MULTIVITAMIN W/MINERALS CH
1.0000 | ORAL_TABLET | Freq: Every day | ORAL | Status: DC
  Administered 2018-02-28 – 2018-03-01 (×2): 1 via ORAL
  Filled 2018-02-28 (×2): qty 1

## 2018-02-28 MED ORDER — FOLIC ACID 1 MG PO TABS
1.0000 mg | ORAL_TABLET | Freq: Every day | ORAL | Status: DC
  Administered 2018-02-28 – 2018-03-01 (×2): 1 mg via ORAL
  Filled 2018-02-28 (×2): qty 1

## 2018-02-28 MED ORDER — STROKE: EARLY STAGES OF RECOVERY BOOK
Freq: Once | Status: AC
  Administered 2018-02-28: 22:00:00

## 2018-02-28 MED ORDER — LORAZEPAM 1 MG PO TABS: 0.0000 mg | ORAL_TABLET | Freq: Two times a day (BID) | ORAL | Status: DC

## 2018-02-28 MED ORDER — ACETAMINOPHEN 325 MG PO TABS
650.0000 mg | ORAL_TABLET | ORAL | Status: DC | PRN
  Administered 2018-02-28: 650 mg via ORAL
  Filled 2018-02-28: qty 2

## 2018-02-28 MED ORDER — ASPIRIN EC 325 MG PO TBEC
325.0000 mg | DELAYED_RELEASE_TABLET | Freq: Every day | ORAL | Status: DC
  Administered 2018-03-01: 12:00:00 325 mg via ORAL
  Filled 2018-02-28: qty 1

## 2018-02-28 MED ORDER — LORAZEPAM 1 MG PO TABS: 1.0000 mg | ORAL_TABLET | Freq: Four times a day (QID) | ORAL | Status: DC | PRN

## 2018-02-28 MED ORDER — VITAMIN B-1 100 MG PO TABS
100.0000 mg | ORAL_TABLET | Freq: Every day | ORAL | Status: DC
  Administered 2018-02-28 – 2018-03-01 (×2): 100 mg via ORAL
  Filled 2018-02-28 (×3): qty 1

## 2018-02-28 MED ORDER — SODIUM CHLORIDE 0.9 % IV SOLN
INTRAVENOUS | Status: DC
  Administered 2018-02-28 – 2018-03-01 (×2): via INTRAVENOUS

## 2018-02-28 NOTE — Plan of Care (Signed)

## 2018-02-28 NOTE — H&P (Signed)
[facility] [place] [facility]   PATIENT NAME: Parker PaymentJames Russett    MR#:  [id number]  DATE OF BIRTH:  [birth year]  DATE OF ADMISSION:  02/28/2018  PRIMARY CARE PHYSICIAN: Patient, No Pcp Per   REQUESTING/REFERRING PHYSICIAN: dr Fanny Bienquale  CHIEF COMPLAINT:    Right-sided numbness HISTORY OF PRESENT ILLNESS:  Parker Thompson  is a 42 y.o. male with a known history of tobacco dependence who presents with above complaint.  Patient reports over the past 3 days he has had right-sided numbness of his arm as well as face along with weakness in his right arm and right leg..  It lasted few minutes and goes away.  He has no other focal neurological deficits.  Initial CT of the head was negative for acute stroke however patient continues to have right-sided weakness.  PAST MEDICAL HISTORY:   Past Medical History:  Diagnosis Date  . Drug abuse and dependence (HCC)   . ETOH abuse   . Gastric ulcer   . Pancreatitis     PAST SURGICAL HISTORY:  None  SOCIAL HISTORY:   Social History   Tobacco Use  . Smoking status: Current Every Day Smoker    Packs/day: 0.50    Types: Cigarettes  . Smokeless tobacco: Never Used  Substance Use Topics  . Alcohol use: Yes    FAMILY HISTORY:  No history of CVA or CAD  DRUG ALLERGIES:   Allergies  Allergen Reactions  . Shellfish Allergy Swelling    REVIEW OF SYSTEMS:   Review of Systems  Constitutional: Negative.  Negative for chills, fever and malaise/fatigue.  HENT: Negative.  Negative for ear discharge, ear pain, hearing loss, nosebleeds and sore throat.   Eyes: Negative.  Negative for blurred vision and pain.  Respiratory: Negative.  Negative for cough, hemoptysis, shortness of breath and wheezing.   Cardiovascular: Negative.  Negative for chest pain, palpitations and leg swelling.  Gastrointestinal: Negative.  Negative for abdominal pain, blood in stool, diarrhea, nausea and vomiting.  Genitourinary: Negative.  Negative for  dysuria.  Musculoskeletal: Negative.  Negative for back pain.  Skin: Negative.   Neurological: Positive for sensory change and focal weakness. Negative for dizziness, tremors, speech change, seizures and headaches.  Endo/Heme/Allergies: Negative.  Does not bruise/bleed easily.  Psychiatric/Behavioral: Negative.  Negative for depression, hallucinations and suicidal ideas.    MEDICATIONS AT HOME:   Prior to Admission medications   Medication Sig Start Date End Date Taking? Authorizing Provider  dicyclomine (BENTYL) 20 MG tablet Take 1 tablet (20 mg total) by mouth 3 (three) times daily as needed for spasms. Patient not taking: Reported on 08/05/2017 03/01/15 02/29/16  Myrna BlazerSchaevitz, David Matthew, MD  HYDROcodone-acetaminophen (NORCO) 5-325 MG tablet Take 1 tablet by mouth every 6 (six) hours as needed for severe pain. Patient not taking: Reported on 08/05/2017 09/24/15   Hagler, Jami L, PA-C  naproxen (NAPROSYN) 500 MG tablet Take 1 tablet (500 mg total) by mouth 2 (two) times daily with a meal. Patient not taking: Reported on 08/05/2017 08/28/16   Sharman CheekStafford, Phillip, MD  omeprazole (PRILOSEC) 40 MG capsule Take 1 capsule (40 mg total) by mouth daily. Patient not taking: Reported on 08/05/2017 03/01/15 02/29/16  Myrna BlazerSchaevitz, David Matthew, MD      VITAL SIGNS:  Blood pressure 111/85, pulse 95, temperature 98.6 F (37 C), temperature source Oral, resp. rate 18, height 5\' 9"  (1.753 m), weight 78 kg, SpO2 95 %.  PHYSICAL EXAMINATION:   Physical Exam  Constitutional: He is oriented  to person, place, and time. No distress.  HENT:  Head: Normocephalic.  Eyes: No scleral icterus.  Neck: Normal range of motion. Neck supple. No JVD present. No tracheal deviation present.  Cardiovascular: Normal rate, regular rhythm and normal heart sounds. Exam reveals no gallop and no friction rub.  No murmur heard. Pulmonary/Chest: Effort normal and breath sounds normal. No respiratory distress. He has no wheezes. He has  no rales. He exhibits no tenderness.  Abdominal: Soft. Bowel sounds are normal. He exhibits no distension and no mass. There is no tenderness. There is no rebound and no guarding.  Musculoskeletal: Normal range of motion. He exhibits no edema.  Neurological: He is alert and oriented to person, place, and time.  Skin: Skin is warm. No rash noted. No erythema.  Psychiatric: Judgment normal.      LABORATORY PANEL:   CBC Recent Labs  Lab 02/28/18 1857  WBC 10.1  HGB 16.9  HCT 48.1  PLT 290   ------------------------------------------------------------------------------------------------------------------  Chemistries  Recent Labs  Lab 02/28/18 1857  NA 136  K 3.9  CL 105  CO2 23  GLUCOSE 91  BUN 9  CREATININE 0.97  CALCIUM 8.9  AST 33  ALT 28  ALKPHOS 85  BILITOT 0.4   ------------------------------------------------------------------------------------------------------------------  Cardiac Enzymes Recent Labs  Lab 02/28/18 1857  TROPONINI <0.03   ------------------------------------------------------------------------------------------------------------------  RADIOLOGY:  Ct Head Wo Contrast  Result Date: 02/28/2018 CLINICAL DATA:  Patient with right-sided body numbness, intermittent for 1 month. EXAM: CT HEAD WITHOUT CONTRAST TECHNIQUE: Contiguous axial images were obtained from the base of the skull through the vertex without intravenous contrast. COMPARISON:  None. FINDINGS: Brain: Ventricles and sulci are appropriate for patient's age. No evidence for acute cortically based infarct, intracranial hemorrhage, mass lesion or mass-effect. Vascular: Unremarkable. Skull: Intact. Sinuses/Orbits: Paranasal sinuses are well aerated. Mastoid air cells unremarkable. Other: None. IMPRESSION: No acute intracranial process. Electronically Signed   By: Annia Belt M.D.   On: 02/28/2018 19:38    EKG:  Normal sinus rhythm no ST elevation or depression  IMPRESSION AND PLAN:    42 year old male with tobacco dependence who presents with right-sided weakness.  1.  Acute CVA: CVA work-up including MRI/MRA, carotid Doppler and echocardiogram Aspirin 325 daily Consultation with neurology, PT, OT, speech Check lipid panel and A1c  2. Tobacco dependence: Patient is encouraged to quit smoking. Counseling was provided for 4 minutes.  3.  EtOH dependence: CIWA protocol initiated.   All the records are reviewed and case discussed with ED provider. Management plans discussed with the patient and he is in agreement  CODE STATUS: full  TOTAL TIME TAKING CARE OF THIS PATIENT: 45 minutes.    Benji Poynter M.D on 02/28/2018 at 8:26 PM  Between 7am to 6pm - Pager - [phone]  After 6pm go to [email or web address] - password [employer]  [facility]  Office  [phone]  CC: Primary care physician; Patient, No Pcp Per

## 2018-02-28 NOTE — ED Triage Notes (Signed)
Pt states R sided body numbness on and off x over a month. States twitching as well that comes and goes. Alert, oriented, ambulatory. Denies falls. Denies blood thinners. Speaking in complete sentences. No speech problems at this time. No vision changes.

## 2018-02-28 NOTE — ED Notes (Signed)
Pt states that he had a spell about three days ago where he felt numb on the right side of his body and started having some slurred speech. Pt states that the numbness in his face resolved but his right arm has still been numb.

## 2018-02-28 NOTE — ED Provider Notes (Signed)
[facility] Emergency Department Provider Note   ____________________________________________   First MD Initiated Contact with Patient 02/28/18 2008     (approximate)  I have reviewed the triage vital signs and the nursing notes.   HISTORY  Chief Complaint Numbness    HPI Parker Thompson is a 42 y.o. male here for evaluation of right-sided numbness  Patient reports for about a month now is noticed that he had a numb feeling over the right side of his body from the face to the foot.  3 days ago he also noticed that he felt like the right side of his face was not working in his face was drawing up on the left side, and he could use the right side of his face for about 3 or 4 minutes.  He also has noticed that he was able to push the lawn more well in his right arm and right leg seem weak for about the last 2 to 3 days.  Came today as he has not been getting any better.  Last known well was about 3 days ago.  No headache.  No nausea vomiting.  No fevers or chills.  Never had similar symptoms in the past.  Previous alcohol use.  No chest pain or trouble breathing.  Past Medical History:  Diagnosis Date  . Drug abuse and dependence (HCC)   . ETOH abuse   . Gastric ulcer   . Pancreatitis     Patient Active Problem List   Diagnosis Date Noted  . 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 No pertinent surgical history.  Prior to Admission medications   Medication Sig Start Date End Date Taking? Authorizing Provider  dicyclomine (BENTYL) 20 MG tablet Take 1 tablet (20 mg total) by mouth 3 (three) times daily as needed for spasms. Patient not taking: Reported on 08/05/2017 03/01/15 02/29/16  Myrna BlazerSchaevitz, David Matthew, MD  HYDROcodone-acetaminophen (NORCO) 5-325 MG tablet Take 1 tablet by mouth every 6 (six) hours as needed for severe pain. Patient not taking: Reported on 08/05/2017 09/24/15   Hagler, Jami L, PA-C  naproxen (NAPROSYN) 500 MG tablet  Take 1 tablet (500 mg total) by mouth 2 (two) times daily with a meal. Patient not taking: Reported on 08/05/2017 08/28/16   Sharman CheekStafford, Phillip, MD  omeprazole (PRILOSEC) 40 MG capsule Take 1 capsule (40 mg total) by mouth daily. Patient not taking: Reported on 08/05/2017 03/01/15 02/29/16  Schaevitz, Myra Rudeavid Matthew, MD    Allergies Shellfish allergy  History reviewed. No pertinent family history.  Social History Social History   Tobacco Use  . Smoking status: Current Every Day Smoker    Packs/day: 0.50    Types: Cigarettes  . Smokeless tobacco: Never Used  Substance Use Topics  . Alcohol use: Yes  . Drug use: Yes    Types: Cocaine    Review of Systems Constitutional: No fever/chills Eyes: No visual changes. ENT: No sore throat. Cardiovascular: Denies chest pain. Respiratory: Denies shortness of breath. Gastrointestinal: No abdominal pain.  No nausea, no vomiting.  No diarrhea.  No constipation. Genitourinary: Negative for dysuria. Musculoskeletal: Negative for back pain. Skin: Negative for rash. Neurological: Negative for neck pain.  Numbness over the right side.  Weakness in the right arm and right leg.  Right side of the face 1 work 3 days ago.    ____________________________________________   PHYSICAL EXAM:  VITAL SIGNS: ED Triage Vitals  Enc Vitals Group     BP 02/28/18 1854 111/85  Pulse Rate 02/28/18 1854 95     Resp 02/28/18 1854 18     Temp 02/28/18 1854 98.6 F (37 C)     Temp Source 02/28/18 1854 Oral     SpO2 02/28/18 1854 95 %     Weight 02/28/18 1853 172 lb (78 kg)     Height 02/28/18 1853 5\' 9"  (1.753 m)     Head Circumference --      Peak Flow --      Pain Score 02/28/18 1853 0     Pain Loc --      Pain Edu? --      Excl. in GC? --     Constitutional: Alert and oriented. Well appearing and in no acute distress. Eyes: Conjunctivae are normal. Head: Atraumatic. Nose: No congestion/rhinnorhea. Mouth/Throat: Mucous membranes are  moist. Neck: No stridor.   Cardiovascular: Normal rate, regular rhythm. Grossly normal heart sounds.  Good peripheral circulation. Respiratory: Normal respiratory effort.  No retractions. Lungs CTAB. Gastrointestinal: Soft and nontender. No distention. Musculoskeletal: No lower extremity tenderness nor edema. Neurologic:  Normal speech and language.  Numbness right face right arm right leg.  Weakness about 4+ right arm versus left which is normal.  There is no facial droop evident today.  There is pronator drift involving the right upper extremity.  Left-sided extremities normal and strength.  Right upper right lower extremity slightly reduced strength.  There is focal findings primarily only over the right side.  Skin:  Skin is warm, dry and intact. No rash noted. Psychiatric: Mood and affect are normal. Speech and behavior are normal.  ____________________________________________   LABS (all labs ordered are listed, but only abnormal results are displayed)  Labs Reviewed  CBC - Abnormal; Notable for the following components:      Result Value   RDW 15.0 (*)    All other components within normal limits  DIFFERENTIAL - Abnormal; Notable for the following components:   Neutro Abs 7.1 (*)    All other components within normal limits  PROTIME-INR  APTT  COMPREHENSIVE METABOLIC PANEL  TROPONIN I  CBG MONITORING, ED   ____________________________________________  EKG  Reviewed and entered by me at 1900 Heart rate 90 QRS 95 QTc 440 Normal sinus rhythm, no evidence of acute ischemia. ____________________________________________  RADIOLOGY  CT of the head today reviewed is normal ____________________________________________   PROCEDURES  Procedure(s) performed: None  Procedures  Critical Care performed: No  ____________________________________________   INITIAL IMPRESSION / ASSESSMENT AND PLAN / ED COURSE  Pertinent labs & imaging results that were available during  my care of the patient were reviewed by me and considered in my medical decision making (see chart for details).  Right-sided focal neurologic deficits.  Clinical history with the drying of his face 3 days ago and now persistent right-sided arm weakness numbness and pronator drift highly suggestive of an acute stroke.  CT of the head negative for acute stroke, well outside of any TPA window and not a interventional candidate as his symptoms started 2 to 3 days ago. VAN negative.  Discussed with patient, will admit for further work-up.  Patient in agreement and understanding of plan.  Discussed with Dr. Juliene Pina of the hospitalist service.  Administer salicylates now and anticipate further evaluation including MRI and neurology consult moving forward.     ____________________________________________   FINAL CLINICAL IMPRESSION(S) / ED DIAGNOSES  Final diagnoses:  Cerebrovascular accident (CVA), unspecified mechanism (HCC)      NEW MEDICATIONS STARTED DURING THIS  VISIT:  New Prescriptions   No medications on file     Note:  This document was prepared using Dragon voice recognition software and may include unintentional dictation errors.     Sharyn Creamer, MD 02/28/18 2025

## 2018-03-01 ENCOUNTER — Inpatient Hospital Stay: Payer: Self-pay

## 2018-03-01 ENCOUNTER — Inpatient Hospital Stay
Admit: 2018-03-01 | Discharge: 2018-03-01 | Disposition: A | Discharge status: Home / Self Care | Payer: Self-pay | Attending: Internal Medicine | Admitting: Internal Medicine

## 2018-03-01 ENCOUNTER — Other Ambulatory Visit: Payer: Self-pay

## 2018-03-01 ENCOUNTER — Encounter: Payer: Self-pay | Admitting: Nurse Practitioner

## 2018-03-01 DIAGNOSIS — I639 Cerebral infarction, unspecified: Secondary | ICD-10-CM

## 2018-03-01 LAB — ECHOCARDIOGRAM COMPLETE
Height: 69 in
Weight: 2752 oz

## 2018-03-01 LAB — LIPID PANEL
CHOL/HDL RATIO: 2.8 ratio
CHOLESTEROL: 187 mg/dL (ref 0–200)
HDL: 66 mg/dL (ref >40)
LDL Cholesterol: 102 mg/dL — ABNORMAL HIGH (ref 0–99)
TRIGLYCERIDES: 95 mg/dL (ref <150)
VLDL: 19 mg/dL (ref 0–40)

## 2018-03-01 LAB — HEMOGLOBIN A1C
Hgb A1c MFr Bld: 5.4 % (ref 4.8–5.6)
Mean Plasma Glucose: 108.28 mg/dL

## 2018-03-01 MED ORDER — IOHEXOL 350 MG/ML SOLN
75.0000 mL | Freq: Once | INTRAVENOUS | Status: AC | PRN
  Administered 2018-03-01: 75 mL via INTRAVENOUS

## 2018-03-01 MED ORDER — ASPIRIN EC 81 MG PO TBEC: 81.0000 mg | DELAYED_RELEASE_TABLET | Freq: Every day | ORAL | 1 refills | Status: AC

## 2018-03-01 MED ORDER — PRAVASTATIN SODIUM 40 MG PO TABS: 40.0000 mg | ORAL_TABLET | Freq: Every day | ORAL | 1 refills | Status: DC

## 2018-03-01 NOTE — Consult Note (Addendum)
Referring Physician: Adrian Saran, MD    Chief Complaint: Right side weakness and numbness  HPI: Parker Thompson is an 42 y.o. male with past medical history of drug abuse and dependence, alcohol abuse, gastric ulcers, pancreatitis, presenting with intermittent right-sided facial and leg numbness and weakness.  Patient reports that he has had these symptoms on and off in the last 1 to 2 months.  He states that he was at work at one point and his whole right side became numb in his mouth began twitching to the right.  Symptoms lasted a few seconds and then resolved without any focal deficits.  He presented to the ED yesterday due to worsening symptoms associated with a headache.  He reports that he has been smoking cigarettes about half a pack per day drinks alcohol 2 standard drinks per week.  He admits  to using cocaine occasionally, last time he used cocaine was yesterday.  He noticed that symptoms came on soon after he used the cocaine.  He denies other associated symptoms of visual and speech disturbance, gait difficulty, or other associated focal neurologic deficits.  Initial CT head did not show acute intracranial abnormality, however follow-up MRI brain showed stroke within the left frontal and parietal lobes.  There he was therefore admitted for further stroke work-up. Initial NIHSS of 1.  Date last known well: Unable to determine Time last known well: Unable to determine tPA Given: No: Outside of window period.  Past Medical History:  Diagnosis Date  . Drug abuse and dependence (HCC)   . ETOH abuse   . Gastric ulcer   . Pancreatitis     History reviewed. No pertinent surgical history.  Family History  Problem Relation Age of Onset  . Hypertension Mother   . Kidney failure Brother    Social History:  reports that he has been smoking cigarettes. He has been smoking about 0.50 packs per day. He has never used smokeless tobacco. He reports that he drinks about 2.0 standard drinks of  alcohol per week. He reports that he has current or past drug history. Drug: Cocaine.  Allergies:  Allergies  Allergen Reactions  . Shellfish Allergy Swelling    Medications:  I have reviewed the patient's current medications. Prior to Admission:  Medications Prior to Admission  Medication Sig Dispense Refill Last Dose  . dicyclomine (BENTYL) 20 MG tablet Take 1 tablet (20 mg total) by mouth 3 (three) times daily as needed for spasms. (Patient not taking: Reported on 08/05/2017) 30 tablet 0 Not Taking at Unknown time  . HYDROcodone-acetaminophen (NORCO) 5-325 MG tablet Take 1 tablet by mouth every 6 (six) hours as needed for severe pain. (Patient not taking: Reported on 08/05/2017) 6 tablet 0 Not Taking at Unknown time  . naproxen (NAPROSYN) 500 MG tablet Take 1 tablet (500 mg total) by mouth 2 (two) times daily with a meal. (Patient not taking: Reported on 08/05/2017) 20 tablet 0 Not Taking at Unknown time  . omeprazole (PRILOSEC) 40 MG capsule Take 1 capsule (40 mg total) by mouth daily. (Patient not taking: Reported on 08/05/2017) 30 capsule 1 Not Taking at Unknown time   Scheduled: . aspirin EC  325 mg Oral Daily  . enoxaparin (LOVENOX) injection  40 mg Subcutaneous Q24H  . folic acid  1 mg Oral Daily  . LORazepam  0-4 mg Oral Q6H   Followed by  . [START ON 03/02/2018] LORazepam  0-4 mg Oral Q12H  . multivitamin with minerals  1 tablet Oral  Daily  . nicotine  21 mg Transdermal Daily  . thiamine  100 mg Oral Daily   Or  . thiamine  100 mg Intravenous Daily    ROS: History obtained from the patient   General ROS: negative for - chills, fatigue, fever, night sweats, weight gain or weight loss Psychological ROS: negative for - behavioral disorder, hallucinations, memory difficulties, mood swings or suicidal ideation Ophthalmic ROS: negative for - blurry vision, double vision, eye pain or loss of vision ENT ROS: negative for - epistaxis, nasal discharge, oral lesions, sore throat,  tinnitus or vertigo Allergy and Immunology ROS: negative for - hives or itchy/watery eyes Hematological and Lymphatic ROS: negative for - bleeding problems, bruising or swollen lymph nodes Endocrine ROS: negative for - galactorrhea, hair pattern changes, polydipsia/polyuria or temperature intolerance Respiratory ROS: negative for - cough, hemoptysis, shortness of breath or wheezing Cardiovascular ROS: negative for - chest pain, dyspnea on exertion, edema or irregular heartbeat Gastrointestinal ROS: negative for - abdominal pain, diarrhea, hematemesis, nausea/vomiting or stool incontinence Genito-Urinary ROS: negative for - dysuria, hematuria, incontinence or urinary frequency/urgency Musculoskeletal ROS: negative for - joint swelling or muscular weakness Neurological ROS: as noted in HPI Dermatological ROS: negative for rash and skin lesion changes  Physical Examination: Blood pressure 126/81, pulse 79, temperature 98.2 F (36.8 C), temperature source Oral, resp. rate 18, height 5\' 9"  (1.753 m), weight 78 kg, SpO2 95 %.  Neurological Exam   Mental Status: Alert, oriented, thought content appropriate.  Speech fluent without evidence of aphasia.  Able to follow 3 step commands without difficulty. Attention span and concentration seemed appropriate  Cranial Nerves: II: Discs flat bilaterally; Visual fields grossly normal, pupils equal, round, reactive to light and accommodation III,IV, VI: ptosis not present, extra-ocular motions intact bilaterally V,VII: smile symmetric, facial light touch sensation decreased on the right VIII: hearing normal bilaterally IX,X: gag reflex present XI: bilateral shoulder shrug XII: midline tongue extension Motor: Right :  Upper extremity   4/5 Without pronator drift      Left: Upper extremity   5/5 without pronator drift Right:   Lower extremity   4+/5                                       Left: Lower extremity   5/5 Tone and bulk:normal tone throughout; no  atrophy noted Sensory: Pinprick and light touch decreased on the right upper>lower Deep Tendon Reflexes: 2+ and symmetric throughout Plantars: Right: mute                              Left: mute Cerebellar: Finger-to-nose testing intact on the left, dysmetric on the right. Heel to shin testing normal bilaterally Gait: not tested due to safety concerns  Data Reviewed  Laboratory Studies:  Basic Metabolic Panel: Recent Labs  Lab 02/28/18 1857  NA 136  K 3.9  CL 105  CO2 23  GLUCOSE 91  BUN 9  CREATININE 0.97  CALCIUM 8.9    Liver Function Tests: Recent Labs  Lab 02/28/18 1857  AST 33  ALT 28  ALKPHOS 85  BILITOT 0.4  PROT 7.2  ALBUMIN 4.0   No results for input(s): LIPASE, AMYLASE in the last 168 hours. No results for input(s): AMMONIA in the last 168 hours.  CBC: Recent Labs  Lab 02/28/18 1857  WBC 10.1  NEUTROABS  7.1*  HGB 16.9  HCT 48.1  MCV 95.2  PLT 290    Cardiac Enzymes: Recent Labs  Lab 02/28/18 1857  TROPONINI <0.03    BNP: Invalid input(s): POCBNP  CBG: No results for input(s): GLUCAP in the last 168 hours.  Microbiology: No results found for this or any previous visit.  Coagulation Studies: Recent Labs    02/28/18 1857  LABPROT 11.9  INR 0.88    Urinalysis: No results for input(s): COLORURINE, LABSPEC, PHURINE, GLUCOSEU, HGBUR, BILIRUBINUR, KETONESUR, PROTEINUR, UROBILINOGEN, NITRITE, LEUKOCYTESUR in the last 168 hours.  Invalid input(s): APPERANCEUR  Lipid Panel:    Component Value Date/Time   CHOL 187 03/01/2018 0359   TRIG 95 03/01/2018 0359   HDL 66 03/01/2018 0359   CHOLHDL 2.8 03/01/2018 0359   VLDL 19 03/01/2018 0359   LDLCALC 102 (H) 03/01/2018 0359    HgbA1C:  Lab Results  Component Value Date   HGBA1C 5.4 03/01/2018    Urine Drug Screen:      Component Value Date/Time   LABOPIA NONE DETECTED 08/05/2017 1010   COCAINSCRNUR POSITIVE (A) 08/05/2017 1010   LABBENZ NONE DETECTED 08/05/2017 1010    AMPHETMU NONE DETECTED 08/05/2017 1010   THCU NONE DETECTED 08/05/2017 1010   LABBARB NONE DETECTED 08/05/2017 1010    Alcohol Level: No results for input(s): ETH in the last 168 hours.  Other results: EKG: normal EKG, normal sinus rhythm, unchanged from previous tracings.  Imaging: Dg Eye Foreign Body  Result Date: 03/01/2018 CLINICAL DATA:  Metal working/exposure; clearance prior to MRI. Personal history of metal removed from eye. EXAM: ORBITS FOR FOREIGN BODY - 2 VIEW COMPARISON:  None. FINDINGS: There is no evidence of metallic foreign body within the orbits. No significant bone abnormality identified. IMPRESSION: No evidence of metallic foreign body within the orbits. Electronically Signed   By: Narda RutherfordMelanie  Sanford M.D.   On: 03/01/2018 00:36   Ct Head Wo Contrast  Result Date: 02/28/2018 CLINICAL DATA:  Patient with right-sided body numbness, intermittent for 1 month. EXAM: CT HEAD WITHOUT CONTRAST TECHNIQUE: Contiguous axial images were obtained from the base of the skull through the vertex without intravenous contrast. COMPARISON:  None. FINDINGS: Brain: Ventricles and sulci are appropriate for patient's age. No evidence for acute cortically based infarct, intracranial hemorrhage, mass lesion or mass-effect. Vascular: Unremarkable. Skull: Intact. Sinuses/Orbits: Paranasal sinuses are well aerated. Mastoid air cells unremarkable. Other: None. IMPRESSION: No acute intracranial process. Electronically Signed   By: Annia Beltrew  Davis M.D.   On: 02/28/2018 19:38   Mr Brain Wo Contrast  Result Date: 03/01/2018 CLINICAL DATA:  42 y/o M; complaint of right-sided numbness of the arm and face as well as right-sided weakness. EXAM: MRI HEAD WITHOUT CONTRAST MRA HEAD WITHOUT CONTRAST TECHNIQUE: Multiplanar, multiecho pulse sequences of the brain and surrounding structures were obtained without intravenous contrast. Angiographic images of the head were obtained using MRA technique without contrast. COMPARISON:   02/28/2018 CT head. FINDINGS: MRI HEAD FINDINGS Brain: 6 subcentimeter foci of reduced diffusion compatible with acute/early subacute infarction are present within left frontal and parietal lobes including the pre and postcentral gyri. No associated hemorrhage or mass effect. Few nonspecific T2 FLAIR hyperintensities in periventricular white matter and the pons are compatible with mild chronic microvascular ischemic changes. No susceptibility hypointensity to indicate intracranial hemorrhage. No lesion is present within corpus callosum, juxta cortical white matter, basal ganglia, or cerebellum. No hydrocephalus, extra-axial collection, focal mass effect, or herniation. Vascular: As below. Skull and upper cervical spine:  Normal marrow signal. Sinuses/Orbits: Right anterior ethmoid and sphenoid sinus mucosal thickening. No abnormal signal of mastoid air cells. Orbits are unremarkable. Other: None. MRA HEAD FINDINGS Internal carotid arteries:  Patent. Anterior cerebral arteries:  Patent. Middle cerebral arteries: Patent. Anterior communicating artery: Patent. Posterior communicating arteries:  Patent. Posterior cerebral arteries:  Patent. Basilar artery:  Patent. Vertebral arteries:  Patent. No evidence of high-grade stenosis, large vessel occlusion, or aneurysm. IMPRESSION: 1. 6 subcentimeter foci of acute/early subacute infarction within the left frontal and parietal lobes inclusive of pre and postcentral gyri. No associated hemorrhage or mass effect. 2. White matter hyperintensities in periventricular white matter and the pons, probably age advanced microvascular ischemic changes. No specific findings for demyelination. 3. Mild ethmoid and sphenoid sinus disease. 4. Normal MRA of the head. These results will be called to the ordering clinician or representative by the Radiologist Assistant, and communication documented in the PACS or zVision Dashboard. Electronically Signed   By: Mitzi Hansen M.D.   On:  03/01/2018 01:47   Mr Maxine Glenn Head/brain ZO Cm  Result Date: 03/01/2018 CLINICAL DATA:  42 y/o M; complaint of right-sided numbness of the arm and face as well as right-sided weakness. EXAM: MRI HEAD WITHOUT CONTRAST MRA HEAD WITHOUT CONTRAST TECHNIQUE: Multiplanar, multiecho pulse sequences of the brain and surrounding structures were obtained without intravenous contrast. Angiographic images of the head were obtained using MRA technique without contrast. COMPARISON:  02/28/2018 CT head. FINDINGS: MRI HEAD FINDINGS Brain: 6 subcentimeter foci of reduced diffusion compatible with acute/early subacute infarction are present within left frontal and parietal lobes including the pre and postcentral gyri. No associated hemorrhage or mass effect. Few nonspecific T2 FLAIR hyperintensities in periventricular white matter and the pons are compatible with mild chronic microvascular ischemic changes. No susceptibility hypointensity to indicate intracranial hemorrhage. No lesion is present within corpus callosum, juxta cortical white matter, basal ganglia, or cerebellum. No hydrocephalus, extra-axial collection, focal mass effect, or herniation. Vascular: As below. Skull and upper cervical spine: Normal marrow signal. Sinuses/Orbits: Right anterior ethmoid and sphenoid sinus mucosal thickening. No abnormal signal of mastoid air cells. Orbits are unremarkable. Other: None. MRA HEAD FINDINGS Internal carotid arteries:  Patent. Anterior cerebral arteries:  Patent. Middle cerebral arteries: Patent. Anterior communicating artery: Patent. Posterior communicating arteries:  Patent. Posterior cerebral arteries:  Patent. Basilar artery:  Patent. Vertebral arteries:  Patent. No evidence of high-grade stenosis, large vessel occlusion, or aneurysm. IMPRESSION: 1. 6 subcentimeter foci of acute/early subacute infarction within the left frontal and parietal lobes inclusive of pre and postcentral gyri. No associated hemorrhage or mass effect.  2. White matter hyperintensities in periventricular white matter and the pons, probably age advanced microvascular ischemic changes. No specific findings for demyelination. 3. Mild ethmoid and sphenoid sinus disease. 4. Normal MRA of the head. These results will be called to the ordering clinician or representative by the Radiologist Assistant, and communication documented in the PACS or zVision Dashboard. Electronically Signed   By: Mitzi Hansen M.D.   On: 03/01/2018 01:47    Patient seen and examined.  Clinical course and management discussed.  Necessary edits performed.  I agree with the above.  Assessment and plan of care developed and discussed below.    Assessment: 42 y.o. male with history of drug abuse and dependence, alcohol abuse, gastric ulcers, and pancreatitis presenting with right-sided and facial numbness.  He was found to have left frontal and parietal lobe infarctions on MRI brain which was personally reviewed.  Concern is for  embolic etiology based on appearance of imaging. MRA brain did not show large vessel occlusion.  Carotid dopplers show no evidence of hemodynamically significant stenosis.  Patient was not on any anticoagulant or antiplatelet therapy prior to this event.  LDL 102.  Hemoglobin A1c 5.4.  Stroke Risk Factors - hyperlipidemia and smoking, substance abuse  Plan: 1. Hypercoagulable panel 2. Start aspirin 325 mg once a day 3. EEG 4. PT consult, OT consult, Speech consult 5. Echocardiogram pending if unremarkable will consider TEE.  Patient refuses to wait for TEE to be performed therefore can be performed on an outpatient basis 6. Recommend high potency statin to keep LDL < 70 7. NPO until RN stroke swallow screen 8. Telemetry monitoring 9. Frequent neuro checks   This patient was staffed with Dr. Verlon Au, Thad Ranger who personally evaluated patient, reviewed documentation and agreed with assessment and plan of care as above.  Webb Silversmith, DNP,  FNP-BC Board certified Nurse Practitioner Neurology Department  03/01/2018, 12:50 PM  Thana Farr, MD Neurology [phone]  03/01/2018  2:33 PM

## 2018-03-01 NOTE — Progress Notes (Signed)
SLP Cancellation Note  Patient Details Name: JASDEEP KEPNER MRN: [id number] DOB: 01/29/1976   Cancelled treatment:       Reason Eval/Treat Not Completed: SLP screened, no needs identified, will sign off(chart reviewed; consulted NSG then met w/ pt in room). Pt denied any difficulty swallowing and is currently on a regular diet; tolerates swallowing pills w/ water per NSG. Pt had finished eating his breakfast meal already(feeding self). Pt conversed at conversational level w/out deficits noted; pt denied any speech-language deficits and asked if he could have something more to eat "later". Informed him of the number to the kitchen also.  No further skilled ST services indicated as pt appears at his baseline. Pt agreed. NSG to reconsult if any change in status while admitted.     Orinda Kenner, MS, CCC-SLP Mohammed Mcandrew 03/01/2018, 8:25 AM

## 2018-03-01 NOTE — Consult Note (Signed)
Patient had CVA and was asked to do TEE after Trans-thoracic echo showed normal LVEF, trace MR/PR/TR, and no evidence of thrombii. Patient has appoitment this Monday in my office at 1 pm to set up TEE.

## 2018-03-01 NOTE — Evaluation (Signed)
Occupational Therapy Evaluation Patient Details Name: Gaylord ShihJames D Besancon MRN: [id number] DOB: 10-24-1975 Today's Date: 03/01/2018    History of Present Illness Pt. is a 42 y.o. male [facility] sided facial and UE numbness. Imaging revealed 6 subcentimeter acute/early infarction within the left frontal, and parietal lobes inclusive of pre, and post central gyri. Pt. PMHx includes: Drug abuse, and dependence, ETOH Abuse, Gastric Ulcer, Pancreatitis.   Clinical Impression   Pt. presents with RUE weakness, and  impaired fine motor coordination which limits his ability to engage his right UE  to in ADL and IADL efficiently. Pt. resides at home with his wife.  Pt. was independent with ADLs, and IADL functioning: including meal preparation, and medication management. Pt. was able to drive and was working in a [employer], as well as Aeronautical engineerlandscaping. Pt. education was provided about engaging his right hand during ADL, and IADL tasks. Pt. could benefit from OT services for RUE neuromuscular re education, RUE there. Ex, ADL training, and pt. education in order to improve independence with ADLs, and IADL efficiently with his RUE. Pt. could benefit from follow-up outpatient OT services.    Follow Up Recommendations  Outpatient OT    Equipment Recommendations       Recommendations for Other Services Rehab consult     Precautions / Restrictions Precautions Precautions: None      Mobility Bed Mobility Overal bed mobility: Independent                Transfers Overall transfer level: Independent                    Balance                                           ADL either performed or assessed with clinical judgement   ADL Overall ADL's : Needs assistance/impaired Eating/Feeding: Set up;Independent(limited with the right hand.)   Grooming: Set up(Limited with the right hand)   Upper Body Bathing: Set up;Independent   Lower Body Bathing: Set  up   Upper Body Dressing : Set up;Independent       Toilet Transfer: Independent             General ADL Comments: Impaired UE strength, and coordination during ADL tasks.     Vision Baseline Vision/History: No visual deficits Patient Visual Report: No change from baseline       Perception     Praxis      Pertinent Vitals/Pain Pain Assessment: 0-10     Hand Dominance Right   Extremity/Trunk Assessment Upper Extremity Assessment Upper Extremity Assessment: RUE deficits/detail RUE Deficits / Details: RIght shoulder flexion, abduction 3+/5, elbow flexion, extension 4-/5, wrist extension 4-/5. Impaired grip strength with composite fisting, limited 4th, and 5th digit extension.  RUE Sensation: WNL(Intact light touch, and proprioceptive awareness.) RUE Coordination: decreased fine motor;decreased gross motor           Communication Communication Communication: No difficulties   Cognition Arousal/Alertness: Awake/alert Behavior During Therapy: WFL for tasks assessed/performed Overall Cognitive Status: Within Functional Limits for tasks assessed                                     General Comments       Exercises  Shoulder Instructions      Home Living Family/patient expects to be discharged to:: Private residence Living Arrangements: Spouse/significant other Available Help at Discharge: Family Type of Home: House Home Access: Stairs to enter [employer] of Steps: 3-4   Home Layout: One level     Bathroom Shower/Tub: IT trainer: Standard Bathroom Accessibility: Yes   Home Equipment: None          Prior Functioning/Environment Level of Independence: Independent        Comments: Independent with all ADLs, and IADLs. Pt. worked in a [employer], as well as in Aeronautical engineer.        OT Problem List: Decreased strength;Decreased coordination;Decreased knowledge of use of DME or AE       OT Treatment/Interventions: Self-care/ADL training;Therapeutic exercise;Patient/family education;Therapeutic activities;DME and/or AE instruction;Neuromuscular education    OT Goals(Current goals can be found in the care plan section) Acute Rehab OT Goals Patient Stated Goal: To return home OT Goal Formulation: With patient Potential to Achieve Goals: Good  OT Frequency: Min 2X/week   Barriers to D/C:            Co-evaluation              AM-PAC PT "6 Clicks" Daily Activity     Outcome Measure Help from another person eating meals?: A Little Help from another person taking care of personal grooming?: A Little Help from another person toileting, which includes using toliet, bedpan, or urinal?: None Help from another person bathing (including washing, rinsing, drying)?: A Little Help from another person to put on and taking off regular upper body clothing?: A Little Help from another person to put on and taking off regular lower body clothing?: A Little 6 Click Score: 19   End of Session    Activity Tolerance: Patient tolerated treatment well Patient left: in bed;with call bell/phone within reach;with family/visitor present  OT Visit Diagnosis: Muscle weakness (generalized) (M62.81);Other (comment)                Time: 1610-9604 OT Time Calculation (min): 25 min Charges:  OT General Charges $OT Visit: 1 Visit OT Evaluation $OT Eval Low Complexity: 1 Low  Olegario Messier, MS, OTR/L   Olegario Messier 03/01/2018, 11:14 AM

## 2018-03-01 NOTE — Discharge Summary (Signed)
[facility] - [place] at [facility]   PATIENT NAME: Parker Thompson    MR#:  [id number]  DATE OF BIRTH:  December 14, 1975  DATE OF ADMISSION:  02/28/2018 ADMITTING PHYSICIAN: Adrian Saran, MD  DATE OF DISCHARGE: 03/01/2018  PRIMARY CARE PHYSICIAN: Patient, No Pcp Per    ADMISSION DIAGNOSIS:  Cerebrovascular accident (CVA), unspecified mechanism (HCC) [I63.9]  DISCHARGE DIAGNOSIS:  Active Problems:   Stroke (cerebrum) (HCC)   SECONDARY DIAGNOSIS:   Past Medical History:  Diagnosis Date  . Drug abuse and dependence (HCC)   . ETOH abuse   . Gastric ulcer   . Pancreatitis     HOSPITAL COURSE:   42 year old male with past medical history of polysubstance abuse, alcohol abuse who presented to the hospital due to right sided weakness and numbness.  1.  Acute CVA-this is the cause of patient's right-sided weakness and numbness.  Patient still has some weakness and numbness but it has improved since yesterday.  Patient's MRI was positive for a left-sided parietal/frontal lobe CVA. - This is thought to be secondary to be an embolic stroke.  Patient had hypercoagulable work-up done while in the hospital the results of which is still pending. - Patient was seen by neurology and the recommended a TEE but the patient refuses to stay for TEE.  Patient's TTE is negative for acute thrombus.  Patient be referred to cardiology as an outpatient to get a TEE.  Patient has a follow-up with Dr. Welton Flakes next week on Monday at 1 p.m.  -he will continue aspirin, statin. - Patient was also seen by physical therapy, occupational therapy and also speech and did not require any services as an outpatient or at home.  2.  Polysubstance abuse- patient was strongly advised to abstain from smoking crack and also cigarettes.  3.  Alcohol abuse-patient was maintained on CIWA protocol but clinically did not show any evidence of alcohol withdrawal.  He was strongly advised to quit drinking.  DISCHARGE  CONDITIONS:   Stable  CONSULTS OBTAINED:  Treatment Team:  Thana Farr, MD  DRUG ALLERGIES:   Allergies  Allergen Reactions  . Shellfish Allergy Swelling    DISCHARGE MEDICATIONS:   Allergies as of 03/01/2018      Reactions   Shellfish Allergy Swelling      Medication List    STOP taking these medications   dicyclomine 20 MG tablet Commonly known as:  BENTYL   HYDROcodone-acetaminophen 5-325 MG tablet Commonly known as:  NORCO/VICODIN   naproxen 500 MG tablet Commonly known as:  NAPROSYN   omeprazole 40 MG capsule Commonly known as:  PRILOSEC     TAKE these medications   aspirin EC 81 MG tablet Take 1 tablet (81 mg total) by mouth daily.   pravastatin 40 MG tablet Commonly known as:  PRAVACHOL Take 1 tablet (40 mg total) by mouth daily.         DISCHARGE INSTRUCTIONS:   DIET:  Cardiac diet  DISCHARGE CONDITION:  Stable  ACTIVITY:  Activity as tolerated  OXYGEN:  Home Oxygen: No.   Oxygen Delivery: room air  DISCHARGE LOCATION:  home   If you experience worsening of your admission symptoms, develop shortness of breath, life threatening emergency, suicidal or homicidal thoughts you must seek medical attention immediately by calling 911 or calling your MD immediately  if symptoms less severe.  You Must read complete instructions/literature along with all the possible adverse reactions/side effects for all the Medicines you take and that have been prescribed  to you. Take any new Medicines after you have completely understood and accpet all the possible adverse reactions/side effects.   Please note  You were cared for by a hospitalist during your hospital stay. If you have any questions about your discharge medications or the care you received while you were in the hospital after you are discharged, you can call the unit and asked to speak with the hospitalist on call if the hospitalist that took care of you is not available. Once you are  discharged, your primary care physician will handle any further medical issues. Please note that NO REFILLS for any discharge medications will be authorized once you are discharged, as it is imperative that you return to your primary care physician (or establish a relationship with a primary care physician if you do not have one) for your aftercare needs so that they can reassess your need for medications and monitor your lab values.     Today   Still has some left sided weakness/numbness but improved. No headache and no other acute symptoms. Will d/c home today.   VITAL SIGNS:  Blood pressure 124/84, pulse 87, temperature 98.2 F (36.8 C), temperature source Oral, resp. rate 18, height 5\' 9"  (1.753 m), weight 78 kg, SpO2 96 %.  I/O:    Intake/Output Summary (Last 24 hours) at 03/01/2018 1530 Last data filed at 03/01/2018 1202 Gross per 24 hour  Intake 2354.19 ml  Output 200 ml  Net 2154.19 ml    PHYSICAL EXAMINATION:  GENERAL:  42 y.o.-year-old patient lying in the bed with no acute distress.  EYES: Pupils equal, round, reactive to light and accommodation. No scleral icterus. Extraocular muscles intact.  HEENT: Head atraumatic, normocephalic. Oropharynx and nasopharynx clear.  NECK:  Supple, no jugular venous distention. No thyroid enlargement, no tenderness.  LUNGS: Normal breath sounds bilaterally, no wheezing, rales,rhonchi. No use of accessory muscles of respiration.  CARDIOVASCULAR: S1, S2 normal. No murmurs, rubs, or gallops.  ABDOMEN: Soft, non-tender, non-distended. Bowel sounds present. No organomegaly or mass.  EXTREMITIES: No pedal edema, cyanosis, or clubbing.  NEUROLOGIC: Cranial nerves II through XII are intact. Left upper ext. Strength 4/5.  No other focal deficits b/l.  PSYCHIATRIC: The patient is alert and oriented x 3. Good affect.  SKIN: No obvious rash, lesion, or ulcer.   DATA REVIEW:   CBC Recent Labs  Lab 02/28/18 1857  WBC 10.1  HGB 16.9  HCT 48.1   PLT 290    Chemistries  Recent Labs  Lab 02/28/18 1857  NA 136  K 3.9  CL 105  CO2 23  GLUCOSE 91  BUN 9  CREATININE 0.97  CALCIUM 8.9  AST 33  ALT 28  ALKPHOS 85  BILITOT 0.4    Cardiac Enzymes Recent Labs  Lab 02/28/18 1857  TROPONINI <0.03    Microbiology Results  No results found for this or any previous visit.  RADIOLOGY:  Dg Eye Foreign Body  Result Date: 03/01/2018 CLINICAL DATA:  Metal working/exposure; clearance prior to MRI. Personal history of metal removed from eye. EXAM: ORBITS FOR FOREIGN BODY - 2 VIEW COMPARISON:  None. FINDINGS: There is no evidence of metallic foreign body within the orbits. No significant bone abnormality identified. IMPRESSION: No evidence of metallic foreign body within the orbits. Electronically Signed   By: Narda RutherfordMelanie  Sanford M.D.   On: 03/01/2018 00:36   Ct Head Wo Contrast  Result Date: 02/28/2018 CLINICAL DATA:  Patient with right-sided body numbness, intermittent for 1 month. EXAM:  CT HEAD WITHOUT CONTRAST TECHNIQUE: Contiguous axial images were obtained from the base of the skull through the vertex without intravenous contrast. COMPARISON:  None. FINDINGS: Brain: Ventricles and sulci are appropriate for patient's age. No evidence for acute cortically based infarct, intracranial hemorrhage, mass lesion or mass-effect. Vascular: Unremarkable. Skull: Intact. Sinuses/Orbits: Paranasal sinuses are well aerated. Mastoid air cells unremarkable. Other: None. IMPRESSION: No acute intracranial process. Electronically Signed   By: Annia Belt M.D.   On: 02/28/2018 19:38   Mr Brain Wo Contrast  Result Date: 03/01/2018 CLINICAL DATA:  42 y/o M; complaint of right-sided numbness of the arm and face as well as right-sided weakness. EXAM: MRI HEAD WITHOUT CONTRAST MRA HEAD WITHOUT CONTRAST TECHNIQUE: Multiplanar, multiecho pulse sequences of the brain and surrounding structures were obtained without intravenous contrast. Angiographic images of the  head were obtained using MRA technique without contrast. COMPARISON:  02/28/2018 CT head. FINDINGS: MRI HEAD FINDINGS Brain: 6 subcentimeter foci of reduced diffusion compatible with acute/early subacute infarction are present within left frontal and parietal lobes including the pre and postcentral gyri. No associated hemorrhage or mass effect. Few nonspecific T2 FLAIR hyperintensities in periventricular white matter and the pons are compatible with mild chronic microvascular ischemic changes. No susceptibility hypointensity to indicate intracranial hemorrhage. No lesion is present within corpus callosum, juxta cortical white matter, basal ganglia, or cerebellum. No hydrocephalus, extra-axial collection, focal mass effect, or herniation. Vascular: As below. Skull and upper cervical spine: Normal marrow signal. Sinuses/Orbits: Right anterior ethmoid and sphenoid sinus mucosal thickening. No abnormal signal of mastoid air cells. Orbits are unremarkable. Other: None. MRA HEAD FINDINGS Internal carotid arteries:  Patent. Anterior cerebral arteries:  Patent. Middle cerebral arteries: Patent. Anterior communicating artery: Patent. Posterior communicating arteries:  Patent. Posterior cerebral arteries:  Patent. Basilar artery:  Patent. Vertebral arteries:  Patent. No evidence of high-grade stenosis, large vessel occlusion, or aneurysm. IMPRESSION: 1. 6 subcentimeter foci of acute/early subacute infarction within the left frontal and parietal lobes inclusive of pre and postcentral gyri. No associated hemorrhage or mass effect. 2. White matter hyperintensities in periventricular white matter and the pons, probably age advanced microvascular ischemic changes. No specific findings for demyelination. 3. Mild ethmoid and sphenoid sinus disease. 4. Normal MRA of the head. These results will be called to the ordering clinician or representative by the Radiologist Assistant, and communication documented in the PACS or zVision  Dashboard. Electronically Signed   By: Mitzi Hansen M.D.   On: 03/01/2018 01:47   US Carotid Bilateral (at [facility] Only)  Result Date: 03/01/2018 CLINICAL DATA:  42 year old male with scattered left frontal and parietal infarcts. EXAM: BILATERAL CAROTID DUPLEX ULTRASOUND TECHNIQUE: Wallace Cullens scale imaging, color Doppler and duplex ultrasound were performed of bilateral carotid and vertebral arteries in the neck. COMPARISON:  Brain MRI 03/01/2018 FINDINGS: Criteria: Quantification of carotid stenosis is based on velocity parameters that correlate the residual internal carotid diameter with NASCET-based stenosis levels, using the diameter of the distal internal carotid lumen as the denominator for stenosis measurement. The following velocity measurements were obtained: RIGHT ICA: 74/32 cm/sec CCA: 82/23 cm/sec SYSTOLIC ICA/CCA RATIO:  0.9 ECA:  65 cm/sec LEFT ICA: 67/33 cm/sec CCA: 161/39 cm/sec SYSTOLIC ICA/CCA RATIO:  0.4 ECA:  64 cm/sec RIGHT CAROTID ARTERY: No significant atherosclerotic plaque or evidence of stenosis. RIGHT VERTEBRAL ARTERY:  Patent with normal antegrade flow. LEFT CAROTID ARTERY: Long segment smooth narrowing of the proximal and mid left common carotid artery. The distal common carotid artery and proximal  internal carotid artery are widely patent without evidence of atherosclerotic plaque. LEFT VERTEBRAL ARTERY:  Patent with normal antegrade flow. IMPRESSION: 1. Asymmetric unilateral eccentric narrowing of the proximal and mid left common carotid artery. 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 Smooth atherosclerotic plaque is also possible but seems atypical given the absence of plaque in either internal carotid artery. Consider further evaluation with CTA or MRA of the neck. Normal right-sided carotid system. 2. Vertebral arteries are patent with normal antegrade flow. Signed, Sterling Big, MD, RPVI  [facility] Electronically Signed   By: Malachy Moan M.D.   On: 03/01/2018 13:00   Mr Maxine Glenn Head/brain ZO Cm  Result Date: 03/01/2018 CLINICAL DATA:  42 y/o M; complaint of right-sided numbness of the arm and face as well as right-sided weakness. EXAM: MRI HEAD WITHOUT CONTRAST MRA HEAD WITHOUT CONTRAST TECHNIQUE: Multiplanar, multiecho pulse sequences of the brain and surrounding structures were obtained without intravenous contrast. Angiographic images of the head were obtained using MRA technique without contrast. COMPARISON:  02/28/2018 CT head. FINDINGS: MRI HEAD FINDINGS Brain: 6 subcentimeter foci of reduced diffusion compatible with acute/early subacute infarction are present within left frontal and parietal lobes including the pre and postcentral gyri. No associated hemorrhage or mass effect. Few nonspecific T2 FLAIR hyperintensities in periventricular white matter and the pons are compatible with mild chronic microvascular ischemic changes. No susceptibility hypointensity to indicate intracranial hemorrhage. No lesion is present within corpus callosum, juxta cortical white matter, basal ganglia, or cerebellum. No hydrocephalus, extra-axial collection, focal mass effect, or herniation. Vascular: As below. Skull and upper cervical spine: Normal marrow signal. Sinuses/Orbits: Right anterior ethmoid and sphenoid sinus mucosal thickening. No abnormal signal of mastoid air cells. Orbits are unremarkable. Other: None. MRA HEAD FINDINGS Internal carotid arteries:  Patent. Anterior cerebral arteries:  Patent. Middle cerebral arteries: Patent. Anterior communicating artery: Patent. Posterior communicating arteries:  Patent. Posterior cerebral arteries:  Patent. Basilar artery:  Patent. Vertebral arteries:  Patent. No evidence of high-grade stenosis, large vessel occlusion, or aneurysm. IMPRESSION: 1. 6 subcentimeter foci of acute/early subacute  infarction within the left frontal and parietal lobes inclusive of pre and postcentral gyri. No associated hemorrhage or mass effect. 2. White matter hyperintensities in periventricular white matter and the pons, probably age advanced microvascular ischemic changes. No specific findings for demyelination. 3. Mild ethmoid and sphenoid sinus disease. 4. Normal MRA of the head. These results will be called to the ordering clinician or representative by the Radiologist Assistant, and communication documented in the PACS or zVision Dashboard. Electronically Signed   By: Mitzi Hansen M.D.   On: 03/01/2018 01:47      Management plans discussed with the patient, family and they are in agreement.  CODE STATUS:     Code Status Orders  (From admission, onward)         Start     Ordered   02/28/18 2135  Full code  Continuous     02/28/18 2134        TOTAL TIME TAKING CARE OF THIS PATIENT: 40 minutes.    Houston Siren M.D on 03/01/2018 at 3:30 PM  Between 7am to 6pm - Pager - [phone]  After 6pm go to [email or web address] - Social research officer, government  [facility]  Office  [phone]  CC: Primary care physician; Patient, No Pcp Per

## 2018-03-01 NOTE — Care Management Note (Signed)
Case Management Note  Patient Details  Name: Parker ShihJames D Prue MRN: [id number] Date of Birth: 05/04/1976  Subjective/Objective: Ad[facility] to [facility] with the diagnosis of stroke. Lives with wife, Jasmine DecemberSharon [phone]). No Insurance. No primary care physician listed. States he hasn't seen a doctor since he was a kid. [place]. Never been to [facility].  States that his wife works for [employer].                   Action/Plan: [facility] application given. Will send referral to [facility]. Evaluations pending. Possible charity case for [facility].   Expected Discharge Date:  03/01/18               Expected Discharge Plan:     In-House Referral:   yes  Discharge planning Services     Post Acute Care Choice:    Choice offered to:     DME Arranged:    DME Agency:     HH Arranged:    HH Agency:     Status of Service:     If discussed at [employer] Length of [employer] Meetings, dates discussed:    Additional Comments:  Gwenette GreetBrenda S Makaelah Cranfield, RN MSN CCM Care Management [phone] 03/01/2018, 10:53 AM

## 2018-03-01 NOTE — Evaluation (Signed)
Physical Therapy Evaluation Patient Details Name: Parker ShihJames D Poser MRN: [id number] DOB: 13-Aug-1975 Today's Date: 03/01/2018   History of Present Illness  Pt. is a 42 y.o. male [facility] with right sided facial and UE numbness. Imaging revealed 6 subcentimeter acute/early infarction within the left frontal, and parietal lobes inclusive of pre, and post central gyri. Pt. PMHx includes: Drug abuse, and dependence, ETOH Abuse, Gastric Ulcer, Pancreatitis.  Clinical Impression  Upon evaluation, patient alert and oriented; follows all commands and demonstrates good effort with all mobility tasks. Mild R UE weakness, sensory and coordination deficits appreciated, but patient able to utilize functionally with increased time/attention to extremity.  R LE grossly 4+ to 5/5 throughout; no focal weakness, sensory or coordination deficit appreciated.  Able to complete bed mobility with indep; sit/stand, basic transfers and gait (200') without assist device, mod indep.  Modified DGI (11/12) and isolated balance activities indicate good stability with static and dynamic activitites; minimal fall risk evident. Appears to be at baseline level of functional ability; patient voiced agreement.  No acute PT needs identified at this time.  Will complete initial order; please re-consult as needed should needs change.    Follow Up Recommendations No PT follow up    Equipment Recommendations       Recommendations for Other Services       Precautions / Restrictions Precautions Precautions: None Restrictions Weight Bearing Restrictions: No      Mobility  Bed Mobility Overal bed mobility: Independent                Transfers Overall transfer level: Independent                  Ambulation/Gait Ambulation/Gait assistance: Modified independent (Device/Increase time) Gait Distance (Feet): 200 Feet Assistive device: None       General Gait Details: reciprocal stepping pattern with  good cadence, gait speed; normalized, symmetrical gait mechanics.  Modified DGI 11/12 (limited ability to modulate speed)  Stairs            Wheelchair Mobility    Modified Rankin (Stroke Patients Only)       Balance Overall balance assessment: Needs assistance Sitting-balance support: No upper extremity supported;Feet supported Sitting balance-Leahy Scale: Good     Standing balance support: No upper extremity supported Standing balance-Leahy Scale: Good   Single Leg Stance - Right Leg: 10 Single Leg Stance - Left Leg: 10     Rhomberg - Eyes Opened: 30 Rhomberg - Eyes Closed: 30   High Level Balance Comments: appropriate LE stepping/reation to balance disturbances             Pertinent Vitals/Pain Pain Assessment: No/denies pain    Home Living Family/patient expects to be discharged to:: Private residence Living Arrangements: Spouse/significant other Available Help at Discharge: Family Type of Home: House Home Access: Stairs to enter   Secretary/administratorntrance Stairs-Number of Steps: 3-4 Home Layout: One level Home Equipment: None      Prior Function Level of Independence: Independent         Comments: Independent with all ADLs, and IADLs. 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     Hand Dominance   Dominant Hand: Right    Extremity/Trunk Assessment   Upper Extremity Assessment Upper Extremity Assessment: (R UE grossly 3+ to 4/5 throughout; mild sensory deficit persists, but able to accurately localize, detect light touch; decreased coordination/speed of activation R UE) RUE Deficits / Details: RIght shoulder flexion, abduction 3+/5,  elbow flexion, extension 4-/5, wrist extension 4-/5. Impaired grip strength with composite fisting, limited 4th, and 5th digit extension.  RUE Sensation: WNL(Intact light touch, and proprioceptive awareness.) RUE Coordination: decreased fine motor;decreased gross motor    Lower Extremity Assessment Lower  Extremity Assessment: Overall WFL for tasks assessed(grossly 4+ to 5/5 throughout; no focal weakness or sensory deficit)       Communication   Communication: No difficulties  Cognition Arousal/Alertness: Awake/alert Behavior During Therapy: WFL for tasks assessed/performed Overall Cognitive Status: Within Functional Limits for tasks assessed                                        General Comments      Exercises Other Exercises Other Exercises: Educated on stroke risk factors and prevention (of controllable risk factors); reviewed importance of activity pacing/energy conservation during early recovery; reviewed HEP for R UE. Patient voiced understanding of all information.   Assessment/Plan    PT Assessment Patent does not need any further PT services  PT Problem List         PT Treatment Interventions      PT Goals (Current goals can be found in the Care Plan section)  Acute Rehab PT Goals Patient Stated Goal: To return home PT Goal Formulation: All assessment and education complete, DC therapy Time For Goal Achievement: 03/01/18 Potential to Achieve Goals: Good    Frequency     Barriers to discharge        Co-evaluation               AM-PAC PT "6 Clicks" Daily Activity  Outcome Measure Difficulty turning over in bed (including adjusting bedclothes, sheets and blankets)?: None Difficulty moving from lying on back to sitting on the side of the bed? : None Difficulty sitting down on and standing up from a chair with arms (e.g., wheelchair, bedside commode, etc,.)?: None Help needed moving to and from a bed to chair (including a wheelchair)?: None Help needed walking in hospital room?: None Help needed climbing 3-5 steps with a railing? : None 6 Click Score: 24    End of Session Equipment Utilized During Treatment: Gait belt Activity Tolerance: Patient tolerated treatment well Patient left: in bed;with call bell/phone within reach Nurse  Communication: Mobility status PT Visit Diagnosis: Difficulty in walking, not elsewhere classified (R26.2);Hemiplegia and hemiparesis Hemiplegia - Right/Left: Right Hemiplegia - dominant/non-dominant: Dominant Hemiplegia - caused by: Cerebral infarction    Time: 2440-1027 PT Time Calculation (min) (ACUTE ONLY): 15 min   Charges:   PT Evaluation $PT Eval Low Complexity: 1 Low PT Treatments $Therapeutic Activity: 8-22 mins      Lashika Erker H. Manson Passey, PT, DPT, NCS 03/01/18, 11:33 AM [phone]

## 2018-03-01 NOTE — Progress Notes (Signed)
Pt has been discharged home.  Discharge papers given and explained to pt.  Pt verbalized understanding.  Meds and f/u appointment reviewed. Rx given.

## 2018-03-01 NOTE — Procedures (Signed)
ELECTROENCEPHALOGRAM REPORT   Patient: Parker Thompson       Room #: 101A-AA EEG No. ID: [id number] Age: 42 y.o.        Sex: male Referring Physician: Cherlynn KaiserSainani Report Date:  03/01/2018        Interpreting Physician: Thana FarrEYNOLDS, Luay Balding  History: Parker Thompson is an 42 y.o. male with episodes of twitching  Medications:  ASA, Folvite, MVI, Thiamine  Conditions of Recording:  This is a 21 channel routine scalp EEG performed with bipolar and monopolar montages arranged in accordance to the international 10/20 system of electrode placement. One channel was dedicated to EKG recording.  The patient is in the awake and drowsy states.  Description:  The waking background activity consists of a low voltage, symmetrical, fairly well organized, 9 Hz alpha activity, seen from the parieto-occipital and posterior temporal regions.  Low voltage fast activity, poorly organized, is seen anteriorly and is at times superimposed on more posterior regions.  A mixture of theta and alpha rhythms are seen from the central and temporal regions. The patient drowses with slowing to irregular, low voltage theta and beta activity.   Stage II sleep is not obtained. No epileptiform activity is noted.   Hyperventilation was not performed.  Intermittent photic stimulation was performed but failed to illicit any change in the tracing.     IMPRESSION: Normal electroencephalogram, awake, drowsy and with activation procedures. There are no focal lateralizing or epileptiform features.   Thana FarrLeslie Tyeisha Dinan, MD Neurology [phone] 03/01/2018, 6:42 PM

## 2018-03-01 NOTE — Progress Notes (Signed)
*  PRELIMINARY RESULTS* Echocardiogram 2D Echocardiogram has been performed.  Cristela BlueHege, Yobani Schertzer 03/01/2018, 9:03 AM

## 2018-03-01 NOTE — Progress Notes (Signed)
EEG completed.

## 2018-03-02 LAB — BETA-2-GLYCOPROTEIN I ABS, IGG/M/A
Beta-2 Glyco I IgG: <9 GPI IgG units (ref 0–20)
Beta-2-Glycoprotein I IgM: <9 GPI IgM units (ref 0–32)

## 2018-03-02 LAB — HIV ANTIBODY (ROUTINE TESTING W REFLEX): HIV SCREEN 4TH GENERATION: NONREACTIVE

## 2018-03-02 LAB — PROTEIN S, TOTAL: Protein S Ag, Total: 59 % — ABNORMAL LOW (ref 60–150)

## 2018-03-02 LAB — LUPUS ANTICOAGULANT PANEL
DRVVT: 27.8 s (ref 0.0–47.0)
PTT Lupus Anticoagulant: 33.6 s (ref 0.0–51.9)

## 2018-03-02 LAB — ANTITHROMBIN III: AntiThromb III Func: 84 % (ref 75–120)

## 2018-03-02 LAB — PROTEIN S ACTIVITY: Protein S Activity: 88 % (ref 63–140)

## 2018-03-02 LAB — PROTEIN C ACTIVITY: Protein C Activity: 114 % (ref 73–180)

## 2018-03-03 LAB — PROTEIN C, TOTAL: Protein C, Total: 95 % (ref 60–150)

## 2018-03-03 LAB — CARDIOLIPIN ANTIBODIES, IGG, IGM, IGA
Anticardiolipin IgA: <9 APL U/mL (ref 0–11)
Anticardiolipin IgG: <9 GPL U/mL (ref 0–14)

## 2018-03-04 LAB — HOMOCYSTEINE: Homocysteine: 8 umol/L (ref 0.0–15.0)

## 2018-03-07 LAB — FACTOR 5 LEIDEN

## 2018-03-08 ENCOUNTER — Telehealth: Payer: Self-pay

## 2018-03-08 LAB — PROTHROMBIN GENE MUTATION

## 2018-03-08 NOTE — Telephone Encounter (Signed)
Flagged on EMMI report for not having transportation to follow up.  Attempted to reach patient, however unable to reach at number provided. Mailbox on phone not set up so unable to leave message.

## 2018-06-26 ENCOUNTER — Encounter: Payer: Self-pay | Admitting: Emergency Medicine

## 2018-06-26 ENCOUNTER — Emergency Department
Admission: EM | Admit: 2018-06-26 | Discharge: 2018-06-26 | Disposition: A | Discharge status: Home / Self Care | Payer: Self-pay | Attending: Emergency Medicine | Admitting: Emergency Medicine

## 2018-06-26 ENCOUNTER — Emergency Department: Payer: Self-pay

## 2018-06-26 DIAGNOSIS — R112 Nausea with vomiting, unspecified: Secondary | ICD-10-CM

## 2018-06-26 DIAGNOSIS — R197 Diarrhea, unspecified: Secondary | ICD-10-CM

## 2018-06-26 DIAGNOSIS — F17210 Nicotine dependence, cigarettes, uncomplicated: Secondary | ICD-10-CM | POA: Insufficient documentation

## 2018-06-26 DIAGNOSIS — R1010 Upper abdominal pain, unspecified: Secondary | ICD-10-CM | POA: Insufficient documentation

## 2018-06-26 LAB — COMPREHENSIVE METABOLIC PANEL
ALBUMIN: 3.6 g/dL (ref 3.5–5.0)
ALK PHOS: 73 U/L (ref 38–126)
ALT: 18 U/L (ref 0–44)
ANION GAP: 8 (ref 5–15)
AST: 22 U/L (ref 15–41)
BILIRUBIN TOTAL: 0.4 mg/dL (ref 0.3–1.2)
BUN: 6 mg/dL (ref 6–20)
CALCIUM: 8.8 mg/dL — AB (ref 8.9–10.3)
CO2: 25 mmol/L (ref 22–32)
Chloride: 102 mmol/L (ref 98–111)
Creatinine, Ser: 1.1 mg/dL (ref 0.61–1.24)
GFR calc non Af Amer: >60 mL/min (ref >60)
GLUCOSE: 163 mg/dL — AB (ref 70–99)
POTASSIUM: 3.7 mmol/L (ref 3.5–5.1)
SODIUM: 135 mmol/L (ref 135–145)
TOTAL PROTEIN: 6.9 g/dL (ref 6.5–8.1)

## 2018-06-26 LAB — URINALYSIS, COMPLETE (UACMP) WITH MICROSCOPIC
BILIRUBIN URINE: NEGATIVE
Bacteria, UA: NONE SEEN
Glucose, UA: 150 mg/dL — AB
HGB URINE DIPSTICK: NEGATIVE
KETONES UR: NEGATIVE mg/dL
LEUKOCYTES UA: NEGATIVE
Nitrite: NEGATIVE
PH: 6 (ref 5.0–8.0)
Protein, ur: 30 mg/dL — AB
SQUAMOUS EPITHELIAL / LPF: NONE SEEN (ref 0–5)
Specific Gravity, Urine: 1.028 (ref 1.005–1.030)

## 2018-06-26 LAB — CBC
HCT: 46.9 % (ref 39.0–52.0)
HEMOGLOBIN: 16.3 g/dL (ref 13.0–17.0)
MCH: 32.5 pg (ref 26.0–34.0)
MCHC: 34.8 g/dL (ref 30.0–36.0)
MCV: 93.6 fL (ref 80.0–100.0)
NRBC: 0 % (ref 0.0–0.2)
PLATELETS: 329 10*3/uL (ref 150–400)
RBC: 5.01 MIL/uL (ref 4.22–5.81)
RDW: 11.9 % (ref 11.5–15.5)
WBC: 8.1 10*3/uL (ref 4.0–10.5)

## 2018-06-26 LAB — LIPASE, BLOOD: Lipase: 24 U/L (ref 11–51)

## 2018-06-26 LAB — TROPONIN I: Troponin I: <0.03 ng/mL (ref <0.03)

## 2018-06-26 MED ORDER — DICYCLOMINE HCL 20 MG PO TABS: 20.0000 mg | ORAL_TABLET | Freq: Three times a day (TID) | ORAL | 0 refills | Status: DC | PRN

## 2018-06-26 MED ORDER — OMEPRAZOLE 40 MG PO CPDR: 40.0000 mg | DELAYED_RELEASE_CAPSULE | Freq: Every day | ORAL | 0 refills | Status: DC

## 2018-06-26 MED ORDER — ONDANSETRON HCL 4 MG/2ML IJ SOLN
4.0000 mg | Freq: Once | INTRAMUSCULAR | Status: DC
  Filled 2018-06-26: qty 2

## 2018-06-26 MED ORDER — ALUM & MAG HYDROXIDE-SIMETH 200-200-20 MG/5ML PO SUSP
30.0000 mL | Freq: Once | ORAL | Status: AC
  Administered 2018-06-26: 30 mL via ORAL
  Filled 2018-06-26 (×2): qty 30

## 2018-06-26 MED ORDER — PANTOPRAZOLE SODIUM 40 MG PO TBEC
40.0000 mg | DELAYED_RELEASE_TABLET | Freq: Every day | ORAL | Status: DC
  Administered 2018-06-26: 40 mg via ORAL
  Filled 2018-06-26: qty 1

## 2018-06-26 MED ORDER — LIDOCAINE VISCOUS HCL 2 % MT SOLN
15.0000 mL | Freq: Once | OROMUCOSAL | Status: AC
  Administered 2018-06-26: 15 mL via OROMUCOSAL
  Filled 2018-06-26: qty 15

## 2018-06-26 MED ORDER — ONDANSETRON 4 MG PO TBDP
4.0000 mg | ORAL_TABLET | Freq: Once | ORAL | Status: AC
  Administered 2018-06-26: 4 mg via ORAL

## 2018-06-26 NOTE — ED Notes (Signed)
E-signature not working that this time. Pt verbalized understanding of D/C instructions and prescriptions. No further questions at this time. Pt in NAD at time of D/C & ambulatory to lobby.

## 2018-06-26 NOTE — ED Provider Notes (Signed)
[facility] Emergency Department Provider Note  ____________________________________________   First MD Initiated Contact with Patient 06/26/18 1422     (approximate)  I have reviewed the triage vital signs and the nursing notes.   HISTORY  Chief Complaint Abdominal Pain; Nausea; Emesis; Diarrhea; and Back Pain    HPI Parker Thompson is a 43 y.o. male with a history of alcohol abuse as well as gastric ulcers and pancreatitis was presented emergency department with 1 week of upper abdominal pain as well as nausea vomiting and diarrhea.  He reports drinking approximately 240 ounce bottles of beer per day.  Says the pain is intermittent and sharp and radiating through to his back.  Says it is been similar to bouts of pancreatitis that he is experienced in the past.  Says that he is able to eat and says that he has had small blood streaks in the vomitus but no overt blood.  Also says that he has reporting dark but unsure if he has black stools at this time.  Denies seeing any blood on his toilet tissue.  Denying any pain at this time.  Says that when the pain hits it is a "20 out of 10."  Says that it is a sharp quality.  Says that it only lasts for several seconds at a time.   Past Medical History:  Diagnosis Date  . Drug abuse and dependence (HCC)   . ETOH abuse   . Gastric ulcer   . Pancreatitis     Patient Active Problem List   Diagnosis Date Noted  . 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 No pertinent surgical history.  Prior to Admission medications   Medication Sig Start Date End Date Taking? Authorizing Provider  pravastatin (PRAVACHOL) 40 MG tablet Take 1 tablet (40 mg total) by mouth daily. 03/01/18 04/30/18  Houston Siren, MD    Allergies Shellfish allergy  Family History  Problem Relation Age of Onset  . Hypertension Mother   . Kidney failure Brother     Social History Social History   Tobacco Use  . Smoking  status: Current Every Day Smoker    Packs/day: 0.50    Types: Cigarettes  . Smokeless tobacco: Never Used  Substance Use Topics  . Alcohol use: Yes    Alcohol/week: 2.0 standard drinks    Types: 2 Cans of beer per week    Comment: 40 oZ daily  . Drug use: Yes    Types: Cocaine    Review of Systems  Constitutional: No fever/chills Eyes: No visual changes. ENT: No sore throat. Cardiovascular: Denies chest pain. Respiratory: Denies shortness of breath. Gastrointestinal: No constipation. Genitourinary: Negative for dysuria. Musculoskeletal: Negative for back pain. Skin: Negative for rash. Neurological: Negative for headaches, focal weakness or numbness.   ____________________________________________   PHYSICAL EXAM:  VITAL SIGNS: ED Triage Vitals  Enc Vitals Group     BP 06/26/18 1244 124/81     Pulse Rate 06/26/18 1244 97     Resp 06/26/18 1244 20     Temp 06/26/18 1244 98.8 F (37.1 C)     Temp Source 06/26/18 1244 Oral     SpO2 06/26/18 1244 97 %     Weight 06/26/18 1245 168 lb (76.2 kg)     Height 06/26/18 1245 5\' 9"  (1.753 m)     Head Circumference --      Peak Flow --      Pain Score 06/26/18 1245 10  Pain Loc --      Pain Edu? --      Excl. in GC? --     Constitutional: Alert and oriented.  Mostly well appearing but sometimes does wince and grab his abdomen.  Says that this is the pain that is coming and going.  Pain then is relieved. Eyes: Conjunctivae are normal.  Head: Atraumatic. Nose: No congestion/rhinnorhea. Mouth/Throat: Mucous membranes are moist.  Neck: No stridor.   Cardiovascular: Normal rate, regular rhythm. Grossly normal heart sounds.   Respiratory: Normal respiratory effort.  No retractions. Lungs CTAB. Gastrointestinal: Soft and nontender. No distention. No CVA tenderness.  Heme-negative dark green stool on rectal exam. Musculoskeletal: No lower extremity tenderness nor edema.  No joint effusions. Neurologic:  Normal speech and  language. No gross focal neurologic deficits are appreciated. Skin:  Skin is warm, dry and intact. No rash noted. Psychiatric: Mood and affect are normal. Speech and behavior are normal.  ____________________________________________   LABS (all labs ordered are listed, but only abnormal results are displayed)  Labs Reviewed  COMPREHENSIVE METABOLIC PANEL - Abnormal; Notable for the following components:      Result Value   Glucose, Bld 163 (*)    Calcium 8.8 (*)    All other components within normal limits  URINALYSIS, COMPLETE (UACMP) WITH MICROSCOPIC - Abnormal; Notable for the following components:   Color, Urine AMBER (*)    APPearance CLOUDY (*)    Glucose, UA 150 (*)    Protein, ur 30 (*)    All other components within normal limits  LIPASE, BLOOD  CBC  TROPONIN I  POC OCCULT BLOOD, ED   ____________________________________________  EKG  ED ECG REPORT I, Arelia Longest, the attending physician, personally viewed and interpreted this ECG.   Date: 06/26/2018  EKG Time: 1248  Rate: 89  Rhythm: normal sinus rhythm  Axis: Normal  Intervals: Incomplete right bundle branch block  ST&T Change: No ST segment elevation or depression.  No abnormal T wave inversion.  ____________________________________________  RADIOLOGY  Chest x-ray without any acute process. ____________________________________________   PROCEDURES  Procedure(s) performed:   Procedures  Critical Care performed:   ____________________________________________   INITIAL IMPRESSION / ASSESSMENT AND PLAN / ED COURSE  Pertinent labs & imaging results that were available during my care of the patient were reviewed by me and considered in my medical decision making (see chart for details).  Differential diagnosis includes, but is not limited to, biliary disease (biliary colic, acute cholecystitis, cholangitis, choledocholithiasis, etc), intrathoracic causes for epigastric abdominal pain  including ACS, gastritis, duodenitis, pancreatitis, small bowel or large bowel obstruction, abdominal aortic aneurysm, hernia, and ulcer(s). As part of my medical decision making, I reviewed the following data within the electronic MEDICAL RECORD NUMBER Notes from prior ED visits  ----------------------------------------- 3:53 PM on 06/26/2018 -----------------------------------------  Patient tolerated a sandwich and is resting comfortably at this time.  Reassuring lab work as well as exam.  Chest x-ray negative for any free air under the diaphragm.  I will discharge the patient with Bentyl as well as omeprazole.  Follow-up with GI.  Possible pain related to an ulcer but without evidence of perforation.  Reassuring abdominal exam.  No blood in the stool.  Patient understand the diagnosis well treatment and willing to comply. ____________________________________________   FINAL CLINICAL IMPRESSION(S) / ED DIAGNOSES  Upper abdominal pain.  NEW MEDICATIONS STARTED DURING THIS VISIT:  New Prescriptions   No medications on file     Note:  This document was prepared using Dragon voice recognition software and may include unintentional dictation errors.     Myrna BlazerSchaevitz, Fynn Vanblarcom Matthew, MD 06/26/18 [phone]

## 2018-06-26 NOTE — ED Notes (Signed)
Gave pt urine cup and bag. 

## 2018-06-26 NOTE — ED Notes (Signed)
Pt given meal tray to PO challenge, per MD. Pt able to finish 100% of tray with no N/V noted at this time. MD aware. This RN will continue to monitor.

## 2018-06-26 NOTE — ED Triage Notes (Signed)
Pt reports abd pain all over and back pain with NVD for the last week. Pt reports similar symptoms in the past and once it was his pancreas and the other time it was his intestines.

## 2018-11-13 ENCOUNTER — Other Ambulatory Visit: Payer: Self-pay

## 2018-11-13 ENCOUNTER — Encounter: Payer: Self-pay | Admitting: Emergency Medicine

## 2018-11-13 ENCOUNTER — Emergency Department
Admission: EM | Admit: 2018-11-13 | Discharge: 2018-11-13 | Disposition: A | Discharge status: Home / Self Care | Payer: Self-pay | Attending: Emergency Medicine | Admitting: Emergency Medicine

## 2018-11-13 DIAGNOSIS — K047 Periapical abscess without sinus: Secondary | ICD-10-CM | POA: Insufficient documentation

## 2018-11-13 DIAGNOSIS — F17210 Nicotine dependence, cigarettes, uncomplicated: Secondary | ICD-10-CM | POA: Insufficient documentation

## 2018-11-13 DIAGNOSIS — Z79899 Other long term (current) drug therapy: Secondary | ICD-10-CM | POA: Insufficient documentation

## 2018-11-13 MED ORDER — AMOXICILLIN 500 MG PO CAPS: 500.0000 mg | ORAL_CAPSULE | Freq: Three times a day (TID) | ORAL | 0 refills | Status: DC

## 2018-11-13 MED ORDER — CEFTRIAXONE SODIUM 1 G IJ SOLR
1.0000 g | Freq: Once | INTRAMUSCULAR | Status: AC
  Administered 2018-11-13: 1 g via INTRAMUSCULAR
  Filled 2018-11-13: qty 10

## 2018-11-13 MED ORDER — LIDOCAINE VISCOUS HCL 2 % MT SOLN
15.0000 mL | Freq: Once | OROMUCOSAL | Status: AC
  Administered 2018-11-13: 15 mL via OROMUCOSAL
  Filled 2018-11-13: qty 15

## 2018-11-13 MED ORDER — LIDOCAINE VISCOUS HCL 2 % MT SOLN: 10.0000 mL | OROMUCOSAL | 0 refills | Status: DC | PRN

## 2018-11-13 MED ORDER — KETOROLAC TROMETHAMINE 30 MG/ML IJ SOLN
30.0000 mg | Freq: Once | INTRAMUSCULAR | Status: AC
  Administered 2018-11-13: 30 mg via INTRAMUSCULAR
  Filled 2018-11-13: qty 1

## 2018-11-13 NOTE — ED Notes (Signed)
Reports left side facial swelling x 2 days now, states has broken tooth but does not feel pain at the site.

## 2018-11-13 NOTE — ED Triage Notes (Addendum)
Pt c/o LFT sided facial swelling. PT states he has a broken tooth but denies dental pain. Slight swelling noted. Denies fever or new medication or allergies

## 2018-11-13 NOTE — ED Provider Notes (Signed)
[facility] Emergency Department Provider Note  ____________________________________________  Time seen: Approximately 10:58 PM  I have reviewed the triage vital signs and the nursing notes.   HISTORY  Chief Complaint No chief complaint on file.    HPI Parker Thompson is a 43 y.o. male that presents to the emergency department for evaluation of left sided facial pain and swelling for 2 days.  Patient states that he has a broken top molar.  He can touch his tooth with his tongue and does not have pain but he has pain anywhere around his tooth that he is touches.  Pain is worse to his cheek near his nose.  It is painful to chew.  He has not had any fevers.  He does not take any medications daily.     Past Medical History:  Diagnosis Date  . Drug abuse and dependence (HCC)   . ETOH abuse   . Gastric ulcer   . Pancreatitis     Patient Active Problem List   Diagnosis Date Noted  . 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 No pertinent surgical history.  Prior to Admission medications   Medication Sig Start Date End Date Taking? Authorizing Provider  amoxicillin (AMOXIL) 500 MG capsule Take 1 capsule (500 mg total) by mouth 3 (three) times daily. 11/13/18   Enid DerryWagner, Zeno Hickel, PA-C  dicyclomine (BENTYL) 20 MG tablet Take 1 tablet (20 mg total) by mouth 3 (three) times daily as needed for spasms. 06/26/18 06/26/19  Schaevitz, Myra Rudeavid Matthew, MD  lidocaine (XYLOCAINE) 2 % solution Use as directed 10 mLs in the mouth or throat as needed. 11/13/18   Enid DerryWagner, Adjoa Althouse, PA-C  omeprazole (PRILOSEC) 40 MG capsule Take 1 capsule (40 mg total) by mouth daily. 06/26/18 07/26/18  Myrna BlazerSchaevitz, David Matthew, MD  pravastatin (PRAVACHOL) 40 MG tablet Take 1 tablet (40 mg total) by mouth daily. 03/01/18 04/30/18  Houston SirenSainani, Vivek J, MD    Allergies Shellfish allergy  Family History  Problem Relation Age of Onset  . Hypertension Mother   . Kidney failure Brother     Social  History Social History   Tobacco Use  . Smoking status: Current Every Day Smoker    Packs/day: 0.50    Types: Cigarettes  . Smokeless tobacco: Never Used  Substance Use Topics  . Alcohol use: Yes    Alcohol/week: 2.0 standard drinks    Types: 2 Cans of beer per week    Comment: 40 oZ daily  . Drug use: Yes    Types: Cocaine     Review of Systems  Constitutional: No fever/chills Gastrointestinal:  No nausea, no vomiting.  Musculoskeletal: Negative for musculoskeletal pain. Skin: Negative for rash, abrasions, lacerations, ecchymosis.   ____________________________________________   PHYSICAL EXAM:  VITAL SIGNS: ED Triage Vitals  Enc Vitals Group     BP 11/13/18 2046 (!) 150/92     Pulse Rate 11/13/18 2046 (!) 105     Resp 11/13/18 2046 16     Temp 11/13/18 2046 98.8 F (37.1 C)     Temp Source 11/13/18 2046 Oral     SpO2 11/13/18 2046 98 %     Weight --      Height --      Head Circumference --      Peak Flow --      Pain Score 11/13/18 2045 10     Pain Loc --      Pain Edu? --      Excl.  in GC? --      Constitutional: Alert and oriented. Well appearing and in no acute distress. Eyes: Conjunctivae are normal. PERRL. EOMI. Head: Atraumatic. ENT:      Ears:      Nose: No congestion/rhinnorhea.      Mouth/Throat: Mucous membranes are moist.  Large fracture to right top back molar.  Tenderness to palpation surrounding tooth.  Tenderness to palpation to anterior cheek proximal to left side of nose.  Mild swelling to right cheek.  Full range of motion of jaw.  No submandibular swelling. Neck: No stridor.   Cardiovascular: Normal rate, regular rhythm.  Good peripheral circulation. Respiratory: Normal respiratory effort without tachypnea or retractions. Lungs CTAB. Good air entry to the bases with no decreased or absent breath sounds. Musculoskeletal: Full range of motion to all extremities. No gross deformities appreciated. Neurologic:  Normal speech and language.  No gross focal neurologic deficits are appreciated.  Skin:  Skin is warm, dry and intact. No rash noted. Psychiatric: Mood and affect are normal. Speech and behavior are normal. Patient exhibits appropriate insight and judgement.   ____________________________________________   LABS (all labs ordered are listed, but only abnormal results are displayed)  Labs Reviewed - No data to display ____________________________________________  EKG   ____________________________________________  RADIOLOGY   No results found.  ____________________________________________    PROCEDURES  Procedure(s) performed:    Procedures    Medications  cefTRIAXone (ROCEPHIN) injection 1 g (1 g Intramuscular Given 11/13/18 2233)  ketorolac (TORADOL) 30 MG/ML injection 30 mg (30 mg Intramuscular Given 11/13/18 2234)  lidocaine (XYLOCAINE) 2 % viscous mouth solution 15 mL (15 mLs Mouth/Throat Given 11/13/18 2234)     ____________________________________________   INITIAL IMPRESSION / ASSESSMENT AND PLAN / ED COURSE  Pertinent labs & imaging results that were available during my care of the patient were reviewed by me and considered in my medical decision making (see chart for details).  Review of the [place] CSRS was performed in accordance of the [facility] prior to dispensing any controlled drugs.     Patient's diagnosis is consistent with dental abscess. Patient will be discharged home with prescriptions for amoxicillin and viscous lidocaine. Patient is to follow up with dentist as directed.  Dental resources were given.  Patient is given ED precautions to return to the ED for any worsening or new symptoms.     ____________________________________________  FINAL CLINICAL IMPRESSION(S) / ED DIAGNOSES  Final diagnoses:  Dental abscess      NEW MEDICATIONS STARTED DURING THIS VISIT:  ED Discharge Orders         Ordered    amoxicillin (AMOXIL) 500 MG capsule  3 times daily     11/13/18  2259    lidocaine (XYLOCAINE) 2 % solution  As needed     11/13/18 2259              This chart was dictated using voice recognition software/Dragon. Despite best efforts to proofread, errors can occur which can change the meaning. Any change was purely unintentional.    Enid Derry, PA-C 11/14/18 0007    Arnaldo Natal, MD 11/14/18 [phone]

## 2018-11-13 NOTE — Discharge Instructions (Signed)
OPTIONS FOR DENTAL FOLLOW UP CARE ° °[place] Department of Health and Human Services - Local Safety Net Dental Clinics °http://www.ncdhhs.gov/dph/oralhealth/services/safetynetclinics.htm °  °[facility] (336-562-3123) ° °[facility] [place] (919-933-9087) ° °[facility] ([phone] ext [phone]) ° °[facility] ([phone]) ° °[facility] ([phone]) °This clinic caters to the indigent population and is on a lottery system. °Location: °[facility], [address], [place] °Clinic Hours: °Wednesdays from 6pm - 9pm, patients seen by a lottery system. °For dates, call or go to www.med.[facility].edu/[facility]/patients/Dental-[facility] °Services: °Cleanings, fillings and simple extractions. °Payment Options: °DENTAL WORK IS FREE OF CHARGE. Bring proof of income or support. °Best way to get seen: °Arrive at 5:15 pm - this is a lottery, NOT first come/first serve, so arriving earlier will not increase your chances of being seen. °  °  °[facility] °[phone] °Select option 1 for emergencies °  °Location: °[facility], [address], [place] °Clinic Hours: °No walk-ins accepted - call the day before to schedule an appointment. °Check in times are 9:30 am and 1:30 pm. °Services: °Simple extractions, temporary fillings, pulpectomy/pulp debridement, uncomplicated abscess drainage. °Payment Options: °PAYMENT IS DUE AT THE TIME OF SERVICE.  Fee is usually $100-200, additional surgical procedures (e.g. abscess drainage) may be extra. °Cash, checks, Visa/MasterCard accepted.  Can file Medicaid if patient is covered for dental - patient should call case worker to check. °No discount for [facility] patients. °Best way to get seen: °MUST call the day before and get onto the schedule. Can usually be seen the next 1-2 days. No walk-ins accepted. °  °  °[facility] °[phone] °   °Location: °[facility], [address], [place] °Clinic Hours: °M, W, Th, F 8am or 1:30pm, Tues 9a or 1:30 - first come/first served. °Services: °Simple extractions, temporary fillings, uncomplicated abscess drainage.  You do not need to be an [place] resident. °Payment Options: °PAYMENT IS DUE AT THE TIME OF SERVICE. °Dental insurance, otherwise sliding scale - bring proof of income or support. °Depending on income and treatment needed, cost is usually $50-200. °Best way to get seen: °Arrive early as it is first come/first served. °  °  °[facility] °[phone] °  °Location: °[address] °Clinic Hours: °Mon-Thu 8a-5p °Services: °Most basic dental services including extractions and fillings. °Payment Options: °PAYMENT IS DUE AT THE TIME OF SERVICE. °Sliding scale, up to 50% off - bring proof if income or support. °Medicaid with dental option accepted. °Best way to get seen: °Call to schedule an appointment, can usually be seen within 2 weeks OR they will try to see walk-ins - show up at 8a or 2p (you may have to wait). °  °  °[facility] °919-245-2435 °[place] RESIDENTS ONLY °  °Location: °[facility], [address], [place], [place] [address] °Clinic Hours: By appointment only. °Monday - Thursday 8am-5pm, Friday 8am-12pm °Services: Cleanings, fillings, extractions. °Payment Options: °PAYMENT IS DUE AT THE TIME OF SERVICE. °Cash, Visa or MasterCard. Sliding scale - $30 minimum per service. °Best way to get seen: °Come in to office, complete packet and make an appointment - need proof of income °or support monies for each household member and proof of [place] residence. °Usually takes about a month to get in. °  °  °[facility] °[phone] °  °Location: °[address]   [place] °Clinic Hours: Walk-in Urgent Care Dental Services are offered Monday-Friday  mornings only. °The numbers of emergencies accepted daily is limited to the number of °providers available. °Maximum 15 - Mondays, Wednesdays & Thursdays °Maximum 10 - Tuesdays & Fridays °Services: °You do not need to be a  County resident to be seen for a dental emergency. °Emergencies are defined as pain, swelling, abnormal bleeding, or dental trauma. Walkins will receive x-rays if needed. °NOTE: Dental cleaning is not an emergency. °Payment Options: °PAYMENT IS DUE AT THE TIME OF SERVICE. °Minimum co-pay is $40.00 for uninsured patients. °Minimum co-pay is $3.00 for Medicaid with dental coverage. °Dental Insurance is accepted and must be presented at time of visit. °Medicare does not cover dental. °Forms of payment: Cash, credit card, checks. °Best way to get seen: °If not previously registered with the clinic, walk-in dental registration begins at 7:15 am and is on a first come/first serve basis. °If previously registered with the clinic, call to make an appointment. °  °  °[facility] °919-776-4359 °[place] RESIDENTS ONLY °  °Location: °[address], [place], [place] °Clinic Hours: °Mon-Thu 10a-2p °Services: Extractions only! °Payment Options: °FREE (donations accepted) - bring proof of income or support °Best way to get seen: °Call and schedule an appointment OR come at 8am on the 1st Monday of every month (except for holidays) when it is first come/first served. °  °  °[facility] °[phone] °  °Location: °[address], [place] °Clinic Hours: °Friday mornings °Services, Payment Options, Best way to get seen: °Call for info °

## 2019-12-10 ENCOUNTER — Other Ambulatory Visit: Payer: Self-pay

## 2019-12-10 DIAGNOSIS — R197 Diarrhea, unspecified: Secondary | ICD-10-CM | POA: Insufficient documentation

## 2019-12-10 DIAGNOSIS — R112 Nausea with vomiting, unspecified: Secondary | ICD-10-CM | POA: Insufficient documentation

## 2019-12-10 DIAGNOSIS — F17210 Nicotine dependence, cigarettes, uncomplicated: Secondary | ICD-10-CM | POA: Insufficient documentation

## 2019-12-10 LAB — CBC
HCT: 49.3 % (ref 39.0–52.0)
Hemoglobin: 17.4 g/dL — ABNORMAL HIGH (ref 13.0–17.0)
MCH: 32.8 pg (ref 26.0–34.0)
MCHC: 35.3 g/dL (ref 30.0–36.0)
MCV: 92.8 fL (ref 80.0–100.0)
Platelets: 295 10*3/uL (ref 150–400)
RBC: 5.31 MIL/uL (ref 4.22–5.81)
RDW: 12.1 % (ref 11.5–15.5)
WBC: 7.5 10*3/uL (ref 4.0–10.5)
nRBC: 0 % (ref 0.0–0.2)

## 2019-12-10 LAB — COMPREHENSIVE METABOLIC PANEL
ALT: 23 U/L (ref 0–44)
AST: 21 U/L (ref 15–41)
Albumin: 3.4 g/dL — ABNORMAL LOW (ref 3.5–5.0)
Alkaline Phosphatase: 79 U/L (ref 38–126)
Anion gap: 8 (ref 5–15)
BUN: 10 mg/dL (ref 6–20)
CO2: 22 mmol/L (ref 22–32)
Calcium: 8.3 mg/dL — ABNORMAL LOW (ref 8.9–10.3)
Chloride: 106 mmol/L (ref 98–111)
Creatinine, Ser: 0.99 mg/dL (ref 0.61–1.24)
GFR calc Af Amer: >60 mL/min (ref >60)
GFR calc non Af Amer: >60 mL/min (ref >60)
Glucose, Bld: 105 mg/dL — ABNORMAL HIGH (ref 70–99)
Potassium: 3.8 mmol/L (ref 3.5–5.1)
Sodium: 136 mmol/L (ref 135–145)
Total Bilirubin: 0.8 mg/dL (ref 0.3–1.2)
Total Protein: 6.5 g/dL (ref 6.5–8.1)

## 2019-12-10 LAB — LIPASE, BLOOD: Lipase: 21 U/L (ref 11–51)

## 2019-12-10 NOTE — ED Triage Notes (Signed)
Pt in with n.v.d since 0200 states not able to keep fluids down. Pt co generalized abd pain. Has hx of pancreatitis.

## 2019-12-11 ENCOUNTER — Emergency Department
Admission: EM | Admit: 2019-12-11 | Discharge: 2019-12-11 | Disposition: A | Discharge status: Home / Self Care | Payer: Self-pay | Attending: Emergency Medicine | Admitting: Emergency Medicine

## 2019-12-11 DIAGNOSIS — R112 Nausea with vomiting, unspecified: Secondary | ICD-10-CM

## 2019-12-11 DIAGNOSIS — R197 Diarrhea, unspecified: Secondary | ICD-10-CM

## 2019-12-11 MED ORDER — LOPERAMIDE HCL 2 MG PO CAPS
4.0000 mg | ORAL_CAPSULE | Freq: Once | ORAL | Status: AC
  Administered 2019-12-11: 4 mg via ORAL
  Filled 2019-12-11: qty 2

## 2019-12-11 MED ORDER — ONDANSETRON HCL 4 MG/2ML IJ SOLN
INTRAMUSCULAR | Status: AC
  Administered 2019-12-11: 4 mg via INTRAVENOUS
  Filled 2019-12-11: qty 2

## 2019-12-11 MED ORDER — ONDANSETRON 4 MG PO TBDP: 4.0000 mg | ORAL_TABLET | Freq: Three times a day (TID) | ORAL | 0 refills | Status: DC | PRN

## 2019-12-11 MED ORDER — LOPERAMIDE HCL 2 MG PO CAPS: 2.0000 mg | ORAL_CAPSULE | Freq: Once | ORAL | Status: DC

## 2019-12-11 MED ORDER — ONDANSETRON HCL 4 MG/2ML IJ SOLN: 4.0000 mg | Freq: Once | INTRAMUSCULAR | Status: AC

## 2019-12-11 MED ORDER — KETOROLAC TROMETHAMINE 30 MG/ML IJ SOLN
30.0000 mg | Freq: Once | INTRAMUSCULAR | Status: DC
  Filled 2019-12-11: qty 1

## 2019-12-11 MED ORDER — SODIUM CHLORIDE 0.9 % IV BOLUS
1000.0000 mL | Freq: Once | INTRAVENOUS | Status: AC
  Administered 2019-12-11: 1000 mL via INTRAVENOUS

## 2019-12-11 NOTE — ED Provider Notes (Signed)
[facility] Emergency Department Provider Note  ____________________________________________   First MD Initiated Contact with Patient 12/11/19 0301     (approximate)  I have reviewed the triage vital signs and the nursing notes.   HISTORY  Chief Complaint Emesis and Diarrhea    HPI Parker Thompson is a 44 y.o. male with below list of previous medical conditions including alcohol abuse, cocaine abuse, pancreatitis and gastric ulcer presents to the emergency department secondary to nausea vomiting diarrhea and upper abdominal discomfort since 2 AM yesterday.  Patient states that symptoms began after eating.  Patient denies any fever.  Patient does admit to EtOH ingestion 2 days ago.        Past Medical History:  Diagnosis Date  . Drug abuse and dependence (HCC)   . ETOH abuse   . Gastric ulcer   . Pancreatitis     Patient Active Problem List   Diagnosis Date Noted  . 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  Prior to Admission medications   Medication Sig Start Date End Date Taking? Authorizing Provider  amoxicillin (AMOXIL) 500 MG capsule Take 1 capsule (500 mg total) by mouth 3 (three) times daily. 11/13/18   Enid Derry, PA-C  dicyclomine (BENTYL) 20 MG tablet Take 1 tablet (20 mg total) by mouth 3 (three) times daily as needed for spasms. 06/26/18 06/26/19  Schaevitz, Myra Rude, MD  lidocaine (XYLOCAINE) 2 % solution Use as directed 10 mLs in the mouth or throat as needed. 11/13/18   Enid Derry, PA-C  omeprazole (PRILOSEC) 40 MG capsule Take 1 capsule (40 mg total) by mouth daily. 06/26/18 07/26/18  Myrna Blazer, MD  pravastatin (PRAVACHOL) 40 MG tablet Take 1 tablet (40 mg total) by mouth daily. 03/01/18 04/30/18  Houston Siren, MD    Allergies Shellfish allergy  Family History  Problem Relation Age of Onset  . Hypertension Mother   . Kidney failure Brother     Social History Social  History   Tobacco Use  . Smoking status: Current Every Day Smoker    Packs/day: 0.50    Types: Cigarettes  . Smokeless tobacco: Never Used  Vaping Use  . Vaping Use: Never used  Substance Use Topics  . Alcohol use: Yes    Alcohol/week: 2.0 standard drinks    Types: 2 Cans of beer per week    Comment: 40 oZ daily  . Drug use: Yes    Types: Cocaine    Review of Systems Constitutional: No fever/chills Eyes: No visual changes. ENT: No sore throat. Cardiovascular: Denies chest pain. Respiratory: Denies shortness of breath. Gastrointestinal: No abdominal pain.  No nausea, no vomiting.  No diarrhea.  No constipation. Genitourinary: Negative for dysuria. Musculoskeletal: Negative for neck pain.  Negative for back pain. Integumentary: Negative for rash. Neurological: Negative for headaches, focal weakness or numbness.   ____________________________________________   PHYSICAL EXAM:  VITAL SIGNS: ED Triage Vitals  Enc Vitals Group     BP 12/10/19 2004 124/70     Pulse Rate 12/10/19 2004 (!) 118     Resp 12/10/19 2004 18     Temp 12/10/19 2004 100 F (37.8 C)     Temp Source 12/10/19 2004 Oral     SpO2 12/10/19 2004 96 %     Weight 12/10/19 2003 77.1 kg (170 lb)     Height 12/10/19 2003 1.753 m (5\' 9" )     Head Circumference --  Peak Flow --      Pain Score 12/10/19 2003 10     Pain Loc --      Pain Edu? --      Excl. in [place]? --      Constitutional: Alert and oriented.  Eyes: Conjunctivae are normal.  Mouth/Throat: Patient is wearing a mask. Neck: No stridor.  No meningeal signs.   Cardiovascular: Normal rate, regular rhythm. Good peripheral circulation. Grossly normal heart sounds. Respiratory: Normal respiratory effort.  No retractions. Gastrointestinal: Soft and nontender. No distention.  Musculoskeletal: No lower extremity tenderness nor edema. No gross deformities of extremities. Neurologic:  Normal speech and language. No gross focal neurologic deficits  are appreciated.  Skin:  Skin is warm, dry and intact. Psychiatric: Mood and affect are normal. Speech and behavior are normal.  ____________________________________________   LABS (all labs ordered are listed, but only abnormal results are displayed)  Labs Reviewed  CBC - Abnormal; Notable for the following components:      Result Value   Hemoglobin 17.4 (*)    All other components within normal limits  COMPREHENSIVE METABOLIC PANEL - Abnormal; Notable for the following components:   Glucose, Bld 105 (*)    Calcium 8.3 (*)    Albumin 3.4 (*)    All other components within normal limits  LIPASE, BLOOD  URINALYSIS, COMPLETE (UACMP) WITH MICROSCOPIC     Procedures   ____________________________________________   INITIAL IMPRESSION / MDM / ASSESSMENT AND PLAN / ED COURSE  As part of my medical decision making, I reviewed the following data within the electronic MEDICAL RECORD NUMBER   44 year old male presented with above-stated history and physical exam with differential diagnosis including but not limited to gastroenteritis gastritis colitis.  Patient with no pain with abdominal palpation.  Laboratory data unremarkable.  Patient given 1 L IV normal saline 4 mg of Zofran and Imodium with no further vomiting or diarrhea in the emergency department.  ____________________________________________  FINAL CLINICAL IMPRESSION(S) / ED DIAGNOSES  Final diagnoses:  Nausea vomiting and diarrhea     MEDICATIONS GIVEN DURING THIS VISIT:  Medications  ondansetron (ZOFRAN) injection 4 mg (has no administration in time range)  loperamide (IMODIUM) capsule 2 mg (has no administration in time range)  sodium chloride 0.9 % bolus 1,000 mL (has no administration in time range)  ondansetron (ZOFRAN) 4 MG/2ML injection (has no administration in time range)     ED Discharge Orders    None      *Please note:  [name] was evaluated in Emergency Department on 12/11/2019 for the symptoms  described in the history of present illness. He was evaluated in the context of the global COVID-19 pandemic, which necessitated consideration that the patient might be at risk for infection with the SARS-CoV-2 virus that causes COVID-19. Institutional protocols and algorithms that pertain to the evaluation of patients at risk for COVID-19 are in a state of rapid change based on information released by regulatory bodies including the CDC and federal and state organizations. These policies and algorithms were followed during the patient's care in the ED.  Some ED evaluations and interventions may be delayed as a result of limited staffing during and after the pandemic.*  Note:  This document was prepared using Dragon voice recognition software and may include unintentional dictation errors.   Gregor Hams, MD 12/11/19 [phone]

## 2020-03-13 ENCOUNTER — Emergency Department: Payer: Self-pay

## 2020-03-13 ENCOUNTER — Other Ambulatory Visit: Payer: Self-pay

## 2020-03-13 ENCOUNTER — Encounter: Payer: Self-pay | Admitting: Emergency Medicine

## 2020-03-13 ENCOUNTER — Emergency Department
Admission: EM | Admit: 2020-03-13 | Discharge: 2020-03-13 | Disposition: A | Discharge status: Home / Self Care | Payer: Self-pay | Attending: Emergency Medicine | Admitting: Emergency Medicine

## 2020-03-13 DIAGNOSIS — F17210 Nicotine dependence, cigarettes, uncomplicated: Secondary | ICD-10-CM | POA: Insufficient documentation

## 2020-03-13 DIAGNOSIS — M47816 Spondylosis without myelopathy or radiculopathy, lumbar region: Secondary | ICD-10-CM | POA: Insufficient documentation

## 2020-03-13 DIAGNOSIS — M47817 Spondylosis without myelopathy or radiculopathy, lumbosacral region: Secondary | ICD-10-CM

## 2020-03-13 MED ORDER — TRAMADOL HCL 50 MG PO TABS: 50.0000 mg | ORAL_TABLET | Freq: Four times a day (QID) | ORAL | 0 refills | Status: AC | PRN

## 2020-03-13 MED ORDER — CYCLOBENZAPRINE HCL 10 MG PO TABS: 10.0000 mg | ORAL_TABLET | Freq: Three times a day (TID) | ORAL | 0 refills | Status: DC | PRN

## 2020-03-13 MED ORDER — LIDOCAINE 5 % EX PTCH
1.0000 | MEDICATED_PATCH | CUTANEOUS | Status: DC
  Administered 2020-03-13: 1 via TRANSDERMAL
  Filled 2020-03-13: qty 1

## 2020-03-13 MED ORDER — IBUPROFEN 600 MG PO TABS: 600.0000 mg | ORAL_TABLET | Freq: Three times a day (TID) | ORAL | 0 refills | Status: DC | PRN

## 2020-03-13 MED ORDER — TRAMADOL HCL 50 MG PO TABS: 50.0000 mg | ORAL_TABLET | Freq: Four times a day (QID) | ORAL | 0 refills | Status: DC | PRN

## 2020-03-13 NOTE — Discharge Instructions (Signed)
Follow discharge care instruction take medication as directed. °

## 2020-03-13 NOTE — ED Notes (Signed)
Topaz pad not working

## 2020-03-13 NOTE — ED Triage Notes (Signed)
Pt to ED iva POV c/o lower back pain. Pt states that has always had issues with his back but this morning was unable to get out of bed due to the pain. Pt is having moving and lifting his left leg due to the pain. Pt denies numbness or tingling in his leg. Pt is in NAD.

## 2020-03-13 NOTE — ED Provider Notes (Signed)
[facility] Emergency Department Provider Note   ____________________________________________   First MD Initiated Contact with Patient 03/13/20 [phone]     (approximate)  I have reviewed the triage vital signs and the nursing notes.   HISTORY  Chief Complaint Back Pain    HPI Parker Thompson is a 44 y.o. male patient presented with acute onset of low back pain.  Patient has a chronic history of low back pain.  Patient stable unable to get out of bed this morning secondary to pain.  Patient has bladder bowel dysfunction.  Patient denies radicular component to his back pain.  Patient said he is unable to go to work today because of the pain.  Rates pain as a 10/10.  Described pain is "achy/sharp".  No palliative measure prior to arrival.         Past Medical History:  Diagnosis Date  . Drug abuse and dependence (HCC)   . ETOH abuse   . Gastric ulcer   . Pancreatitis     Patient Active Problem List   Diagnosis Date Noted  . 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 No pertinent surgical history.  Prior to Admission medications   Medication Sig Start Date End Date Taking? Authorizing Provider  amoxicillin (AMOXIL) 500 MG capsule Take 1 capsule (500 mg total) by mouth 3 (three) times daily. 11/13/18   Enid Derry, PA-C  cyclobenzaprine (FLEXERIL) 10 MG tablet Take 1 tablet (10 mg total) by mouth 3 (three) times daily as needed. 03/13/20   Joni Reining, PA-C  dicyclomine (BENTYL) 20 MG tablet Take 1 tablet (20 mg total) by mouth 3 (three) times daily as needed for spasms. 06/26/18 06/26/19  Schaevitz, Myra Rude, MD  ibuprofen (ADVIL) 600 MG tablet Take 1 tablet (600 mg total) by mouth every 8 (eight) hours as needed. 03/13/20   Joni Reining, PA-C  lidocaine (XYLOCAINE) 2 % solution Use as directed 10 mLs in the mouth or throat as needed. 11/13/18   Enid Derry, PA-C  omeprazole (PRILOSEC) 40 MG capsule Take 1 capsule (40 mg total) by  mouth daily. 06/26/18 07/26/18  Myrna Blazer, MD  ondansetron (ZOFRAN ODT) 4 MG disintegrating tablet Take 1 tablet (4 mg total) by mouth every 8 (eight) hours as needed. 12/11/19   Darci Current, MD  pravastatin (PRAVACHOL) 40 MG tablet Take 1 tablet (40 mg total) by mouth daily. 03/01/18 04/30/18  Houston Siren, MD  traMADol (ULTRAM) 50 MG tablet Take 1 tablet (50 mg total) by mouth every 6 (six) hours as needed. 03/13/20 03/13/21  Joni Reining, PA-C    Allergies Shellfish allergy  Family History  Problem Relation Age of Onset  . Hypertension Mother   . Kidney failure Brother     Social History Social History   Tobacco Use  . Smoking status: Current Every Day Smoker    Packs/day: 0.50    Types: Cigarettes  . Smokeless tobacco: Never Used  Vaping Use  . Vaping Use: Never used  Substance Use Topics  . Alcohol use: Yes    Alcohol/week: 2.0 standard drinks    Types: 2 Cans of beer per week    Comment: 40 oZ daily  . Drug use: Not Currently    Types: Cocaine    Comment: pt not currently using    Review of Systems Constitutional: No fever/chills Eyes: No visual changes. ENT: No sore throat. Cardiovascular: Denies chest pain. Respiratory: Denies shortness of breath. Gastrointestinal:  No abdominal pain.  No nausea, no vomiting.  No diarrhea.  No constipation. Genitourinary: Negative for dysuria. Musculoskeletal: Negative for back pain. Skin: Negative for rash. Neurological: Negative for headaches, focal weakness or numbness. Psychiatric:  Alcohol and drug abuse. Endocrine:  Allergic/Immunilogical: Shellfish allergies. ____________________________________________   PHYSICAL EXAM:  VITAL SIGNS: ED Triage Vitals  Enc Vitals Group     BP 03/13/20 0839 115/74     Pulse Rate 03/13/20 0839 (!) 104     Resp 03/13/20 0839 16     Temp 03/13/20 0839 98.6 F (37 C)     Temp Source 03/13/20 0839 Oral     SpO2 03/13/20 0839 95 %     Weight 03/13/20 0837 169  lb (76.7 kg)     Height 03/13/20 0837 5\' 9"  (1.753 m)     Head Circumference --      Peak Flow --      Pain Score 03/13/20 0837 10     Pain Loc --      Pain Edu? --      Excl. in [place]? --    Constitutional: Alert and oriented.  Mild distress.   Neck: No cervical spine tenderness to palpation. Hematological/Lymphatic/Immunilogical: No cervical lymphadenopathy. Cardiovascular: Normal rate, regular rhythm. Grossly normal heart sounds.  Good peripheral circulation. Respiratory: Normal respiratory effort.  No retractions. Lungs CTAB. Gastrointestinal: Soft and nontender. No distention. No abdominal bruits. No CVA tenderness. Genitourinary: Deferred Musculoskeletal: Patient is sitting in a wheelchair.  Patient was able to stand with her reliance on upper extremities.  No obvious spinal deformity.  Patient decreased range of motion is in all fields.  Patient is moderate guarding palpation of L3-L5.  Negative straight leg test in the sitting position. Neurologic:  Normal speech and language. No gross focal neurologic deficits are appreciated. No gait instability. Skin:  Skin is warm, dry and intact. No rash noted. Psychiatric: Mood and affect are normal. Speech and behavior are normal.  ____________________________________________   LABS (all labs ordered are listed, but only abnormal results are displayed)  Labs Reviewed - No data to display ____________________________________________  EKG   ____________________________________________  RADIOLOGY  ED MD interpretation:    Official radiology report(s): DG Lumbar Spine 2-3 Views  Result Date: 03/13/2020 CLINICAL DATA:  44 year old male presenting with low back pain. No history of trauma. EXAM: LUMBAR SPINE - 2-3 VIEW COMPARISON:  07/13/2017 FINDINGS: There is no evidence of lumbar spine fracture. Straightening of the normal lumbar lordosis. There is minimal, unchanged retrolisthesis of L5 on S1 with associated mild disc space height  loss and osteophyte formation at this level. Alignment is otherwise normal. Intervertebral disc spaces are maintained. IMPRESSION: No acute lumbar spine abnormality. Similar appearing mild discogenic degenerative changes about L5-S1. Electronically Signed   By: 07/15/2017 MD   On: 03/13/2020 10:20    ____________________________________________   PROCEDURES  Procedure(s) performed (including Critical Care):  Procedures   ____________________________________________   INITIAL IMPRESSION / ASSESSMENT AND PLAN / ED COURSE  As part of my medical decision making, I reviewed the following data within the electronic MEDICAL RECORD NUMBER     Patient presents with acute onset pain to lower back.  Discussed x-ray findings showing progressive degenerative changes at the back from 2 years ago.  Patient given discharge care instruction work note.  Advised take medication as directed.  Advise follow-up with orthopedic for definitive evaluation and treatment.          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     ED Discharge Orders         Ordered    traMADol (ULTRAM) 50 MG tablet  Every 6 hours PRN,   Status:  Discontinued        03/13/20 1053    cyclobenzaprine (FLEXERIL) 10 MG tablet  3 times daily PRN,   Status:  Discontinued        03/13/20 1053    ibuprofen (ADVIL) 600 MG tablet  Every 8 hours PRN,   Status:  Discontinued        03/13/20 1053    cyclobenzaprine (FLEXERIL) 10 MG tablet  3 times daily PRN        03/13/20 1055    ibuprofen (ADVIL) 600 MG tablet  Every 8 hours PRN        03/13/20 1055    traMADol (ULTRAM) 50 MG tablet  Every 6 hours PRN        03/13/20 1055          *Please note:  Parker Thompson was evaluated in Emergency Department on 03/13/2020 for the symptoms described in the history of present illness. He was evaluated in the  context of the global COVID-19 pandemic, which necessitated consideration that the patient might be at risk for infection with the SARS-CoV-2 virus that causes COVID-19. Institutional protocols and algorithms that pertain to the evaluation of patients at risk for COVID-19 are in a state of rapid change based on information released by regulatory bodies including the CDC and federal and state organizations. These policies and algorithms were followed during the patient's care in the ED.  Some ED evaluations and interventions may be delayed as a result of limited staffing during and the pandemic.*   Note:  This document was prepared using Dragon voice recognition software and may include unintentional dictation errors.    Joni Reining, PA-C 03/13/20 1103    Jene Every, MD 03/13/20 1120

## 2020-08-24 ENCOUNTER — Emergency Department
Admission: EM | Admit: 2020-08-24 | Discharge: 2020-08-24 | Disposition: A | Discharge status: Home / Self Care | Payer: Self-pay | Attending: Emergency Medicine | Admitting: Emergency Medicine

## 2020-08-24 ENCOUNTER — Emergency Department: Payer: Self-pay

## 2020-08-24 ENCOUNTER — Other Ambulatory Visit: Payer: Self-pay

## 2020-08-24 ENCOUNTER — Encounter: Payer: Self-pay | Admitting: Emergency Medicine

## 2020-08-24 DIAGNOSIS — F17210 Nicotine dependence, cigarettes, uncomplicated: Secondary | ICD-10-CM | POA: Insufficient documentation

## 2020-08-24 DIAGNOSIS — K852 Alcohol induced acute pancreatitis without necrosis or infection: Secondary | ICD-10-CM

## 2020-08-24 DIAGNOSIS — R1084 Generalized abdominal pain: Secondary | ICD-10-CM

## 2020-08-24 DIAGNOSIS — K860 Alcohol-induced chronic pancreatitis: Secondary | ICD-10-CM | POA: Insufficient documentation

## 2020-08-24 LAB — CBC
HCT: 48.5 % (ref 39.0–52.0)
Hemoglobin: 16.8 g/dL (ref 13.0–17.0)
MCH: 32.6 pg (ref 26.0–34.0)
MCHC: 34.6 g/dL (ref 30.0–36.0)
MCV: 94.2 fL (ref 80.0–100.0)
Platelets: 266 10*3/uL (ref 150–400)
RBC: 5.15 MIL/uL (ref 4.22–5.81)
RDW: 11.9 % (ref 11.5–15.5)
WBC: 7.7 10*3/uL (ref 4.0–10.5)
nRBC: 0 % (ref 0.0–0.2)

## 2020-08-24 LAB — COMPREHENSIVE METABOLIC PANEL
ALT: 26 U/L (ref 0–44)
AST: 25 U/L (ref 15–41)
Albumin: 3.2 g/dL — ABNORMAL LOW (ref 3.5–5.0)
Alkaline Phosphatase: 71 U/L (ref 38–126)
Anion gap: 7 (ref 5–15)
BUN: 8 mg/dL (ref 6–20)
CO2: 22 mmol/L (ref 22–32)
Calcium: 8.3 mg/dL — ABNORMAL LOW (ref 8.9–10.3)
Chloride: 106 mmol/L (ref 98–111)
Creatinine, Ser: 1.07 mg/dL (ref 0.61–1.24)
GFR, Estimated: >60 mL/min (ref >60)
Glucose, Bld: 116 mg/dL — ABNORMAL HIGH (ref 70–99)
Potassium: 4.3 mmol/L (ref 3.5–5.1)
Sodium: 135 mmol/L (ref 135–145)
Total Bilirubin: 0.5 mg/dL (ref 0.3–1.2)
Total Protein: 6.2 g/dL — ABNORMAL LOW (ref 6.5–8.1)

## 2020-08-24 LAB — LIPASE, BLOOD: Lipase: 68 U/L — ABNORMAL HIGH (ref 11–51)

## 2020-08-24 MED ORDER — ONDANSETRON 4 MG PO TBDP
4.0000 mg | ORAL_TABLET | Freq: Once | ORAL | Status: AC
  Administered 2020-08-24: 4 mg via ORAL
  Filled 2020-08-24: qty 1

## 2020-08-24 MED ORDER — FAMOTIDINE 20 MG PO TABS: 20.0000 mg | ORAL_TABLET | Freq: Two times a day (BID) | ORAL | 1 refills | Status: DC

## 2020-08-24 MED ORDER — LACTATED RINGERS IV BOLUS
1000.0000 mL | Freq: Once | INTRAVENOUS | Status: AC
  Administered 2020-08-24: 1000 mL via INTRAVENOUS

## 2020-08-24 MED ORDER — LIDOCAINE VISCOUS HCL 2 % MT SOLN
15.0000 mL | Freq: Once | OROMUCOSAL | Status: AC
  Administered 2020-08-24: 15 mL via ORAL
  Filled 2020-08-24: qty 15

## 2020-08-24 MED ORDER — PANTOPRAZOLE SODIUM 40 MG IV SOLR
40.0000 mg | Freq: Once | INTRAVENOUS | Status: AC
  Administered 2020-08-24: 40 mg via INTRAVENOUS
  Filled 2020-08-24: qty 40

## 2020-08-24 MED ORDER — DROPERIDOL 2.5 MG/ML IJ SOLN
2.5000 mg | Freq: Once | INTRAMUSCULAR | Status: AC
  Administered 2020-08-24: 2.5 mg via INTRAVENOUS
  Filled 2020-08-24: qty 2

## 2020-08-24 MED ORDER — IOHEXOL 300 MG/ML  SOLN
100.0000 mL | Freq: Once | INTRAMUSCULAR | Status: AC | PRN
  Administered 2020-08-24: 100 mL via INTRAVENOUS
  Filled 2020-08-24: qty 100

## 2020-08-24 MED ORDER — ALUM & MAG HYDROXIDE-SIMETH 200-200-20 MG/5ML PO SUSP
30.0000 mL | Freq: Once | ORAL | Status: AC
  Administered 2020-08-24: 30 mL via ORAL
  Filled 2020-08-24: qty 30

## 2020-08-24 MED ORDER — ONDANSETRON 4 MG PO TBDP: 4.0000 mg | ORAL_TABLET | Freq: Three times a day (TID) | ORAL | 0 refills | Status: DC | PRN

## 2020-08-24 MED ORDER — MORPHINE SULFATE (PF) 4 MG/ML IV SOLN
4.0000 mg | Freq: Once | INTRAVENOUS | Status: AC
  Administered 2020-08-24: 4 mg via INTRAVENOUS
  Filled 2020-08-24: qty 1

## 2020-08-24 NOTE — Discharge Instructions (Addendum)
Please take Tylenol and ibuprofen/Advil for your pain.  It is safe to take them together, or to alternate them every few hours.  Take up to 1000mg  of Tylenol at a time, up to 4 times per day.  Do not take more than 4000 mg of Tylenol in 24 hours.  For ibuprofen, take 400-600 mg, 4-5 times per day.  Take the Pepcid twice daily every day to help reduce acid in your stomach.   Use Zofran as needed for nausea.   Return to the ED with any worsening symptoms despite these medicines.

## 2020-08-24 NOTE — ED Provider Notes (Signed)
[facility] Emergency Department Provider Note ____________________________________________   Event Date/Time   First MD Initiated Contact with Patient 08/24/20 1243     (approximate)  I have reviewed the triage vital signs and the nursing notes.  HISTORY  Chief Complaint Abdominal Pain and black stool   HPI Parker Thompson is a 45 y.o. malewho presents to the ED for evaluation of abdominal pain and black stool.  Chart review indicates history of alcoholism, gastric ulcers and pancreatitis.  Patient presents to the ED for evaluation of 5 days of central abdominal discomfort and dark stool.  He reports feeling abdominal cramping and pain about 5 days ago, so he picked up some Pepto-Bismol to assist with this.  He reports associated dark stools after taking Pepto-Bismol, without hematochezia, hematemesis or coffee-ground emesis.  He reports persistent sharp and stabbing centralized abdominal pain for the past 4-5 days, acutely worsening and "waves."  He denies any associated fevers, syncope, chest pain or shortness of breath.  He reports he has not drank any alcohol in about 5 days, not drinking as the pain is worsened, and he reports typically drinking about x5 40 ounce beers per day prior to this.  No syncopal episodes or seizure-like activity.  He reports occasionally smoking crack cocaine, last smoking yesterday, and denies IVDU or additional recreational drug use.  Denies suicidality or hallucinations.  He finally reports 2 episodes of associated emesis over the past 5 days.  Nonbloody nonbilious.  He reports his abdominal discomfort is improved with eating food.  Past Medical History:  Diagnosis Date  . Drug abuse and dependence (HCC)   . ETOH abuse   . Gastric ulcer   . Pancreatitis     Patient Active Problem List   Diagnosis Date Noted  . 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 No pertinent surgical history.  Prior to Admission  medications   Medication Sig Start Date End Date Taking? Authorizing Provider  famotidine (PEPCID) 20 MG tablet Take 1 tablet (20 mg total) by mouth 2 (two) times daily. 08/24/20 08/24/21 Yes Delton Prairie, MD  ondansetron (ZOFRAN ODT) 4 MG disintegrating tablet Take 1 tablet (4 mg total) by mouth every 8 (eight) hours as needed for nausea or vomiting. 08/24/20  Yes Delton Prairie, MD  amoxicillin (AMOXIL) 500 MG capsule Take 1 capsule (500 mg total) by mouth 3 (three) times daily. 11/13/18   Enid Derry, PA-C  cyclobenzaprine (FLEXERIL) 10 MG tablet Take 1 tablet (10 mg total) by mouth 3 (three) times daily as needed. 03/13/20   Joni Reining, PA-C  dicyclomine (BENTYL) 20 MG tablet Take 1 tablet (20 mg total) by mouth 3 (three) times daily as needed for spasms. 06/26/18 06/26/19  Schaevitz, Myra Rude, MD  ibuprofen (ADVIL) 600 MG tablet Take 1 tablet (600 mg total) by mouth every 8 (eight) hours as needed. 03/13/20   Joni Reining, PA-C  lidocaine (XYLOCAINE) 2 % solution Use as directed 10 mLs in the mouth or throat as needed. 11/13/18   Enid Derry, PA-C  omeprazole (PRILOSEC) 40 MG capsule Take 1 capsule (40 mg total) by mouth daily. 06/26/18 07/26/18  Myrna Blazer, MD  ondansetron (ZOFRAN ODT) 4 MG disintegrating tablet Take 1 tablet (4 mg total) by mouth every 8 (eight) hours as needed. 12/11/19   Darci Current, MD  pravastatin (PRAVACHOL) 40 MG tablet Take 1 tablet (40 mg total) by mouth daily. 03/01/18 04/30/18  Houston Siren, MD  traMADol (  ULTRAM) 50 MG tablet Take 1 tablet (50 mg total) by mouth every 6 (six) hours as needed. 03/13/20 03/13/21  Joni Reining, PA-C    Allergies Shellfish allergy  Family History  Problem Relation Age of Onset  . Hypertension Mother   . Kidney failure Brother     Social History Social History   Tobacco Use  . Smoking status: Current Every Day Smoker    Packs/day: 0.50    Types: Cigarettes  . Smokeless tobacco: Never Used  Vaping Use   . Vaping Use: Never used  Substance Use Topics  . Alcohol use: Yes    Alcohol/week: 2.0 standard drinks    Types: 2 Cans of beer per week    Comment: 40 oZ daily  . Drug use: Not Currently    Types: Cocaine    Comment: pt not currently using    Review of Systems  Constitutional: No fever/chills Eyes: No visual changes. ENT: No sore throat. Cardiovascular: Denies chest pain. Respiratory: Denies shortness of breath. Gastrointestinal: No abdominal pain, nausea or vomiting. No diarrhea.  No constipation. Genitourinary: Negative for dysuria. Musculoskeletal: Negative for back pain. Skin: Negative for rash. Neurological: Negative for headaches, focal weakness or numbness.  ____________________________________________   PHYSICAL EXAM:  VITAL SIGNS: Vitals:   08/24/20 1525 08/24/20 1528  BP: 119/89 119/89  Pulse:  97  Resp: 17 17  Temp:    SpO2: 95% 95%     Constitutional: Alert and oriented. Well appearing and in no acute distress. Eyes: Conjunctivae are normal. PERRL. EOMI. Head: Atraumatic. Nose: No congestion/rhinnorhea. Mouth/Throat: Mucous membranes are moist.  Oropharynx non-erythematous. Neck: No stridor. No cervical spine tenderness to palpation. Cardiovascular: Normal rate, regular rhythm. Grossly normal heart sounds.  Good peripheral circulation. Respiratory: Normal respiratory effort.  No retractions. Lungs CTAB. Gastrointestinal: Soft , nondistended,. No CVA tenderness. Periumbilical tenderness to palpation without peritoneal features.  Lower abdomen is benign. Musculoskeletal: No lower extremity tenderness nor edema.  No joint effusions. No signs of acute trauma. Neurologic:  Normal speech and language. No gross focal neurologic deficits are appreciated. No gait instability noted. Skin:  Skin is warm, dry and intact. No rash noted. Psychiatric: Mood and affect are normal. Speech and behavior are normal.  ____________________________________________    LABS (all labs ordered are listed, but only abnormal results are displayed)  Labs Reviewed  LIPASE, BLOOD - Abnormal; Notable for the following components:      Result Value   Lipase 68 (*)    All other components within normal limits  COMPREHENSIVE METABOLIC PANEL - Abnormal; Notable for the following components:   Glucose, Bld 116 (*)    Calcium 8.3 (*)    Total Protein 6.2 (*)    Albumin 3.2 (*)    All other components within normal limits  CBC  URINALYSIS, COMPLETE (UACMP) WITH MICROSCOPIC   ____________________________________________  RADIOLOGY  ED MD interpretation: CT abdomen/pelvis reviewed by me without evidence of acute intra-abdominal pathology.  No pseudocyst.  Official radiology report(s): CT ABDOMEN PELVIS W CONTRAST  Result Date: 08/24/2020 CLINICAL DATA:  Acute abdominal pain.  Pancreatitis. EXAM: CT ABDOMEN AND PELVIS WITH CONTRAST TECHNIQUE: Multidetector CT imaging of the abdomen and pelvis was performed using the standard protocol following bolus administration of intravenous contrast. CONTRAST:  OMNIPAQUE IOHEXOL 300 MG/ML  SOLN COMPARISON:  03/01/2015 FINDINGS: Lower chest: Normal Hepatobiliary: Normal Pancreas: Pancreas appears normal. No evidence of acute or chronic inflammatory disease. Spleen: Normal Adrenals/Urinary Tract: Adrenal glands are normal. Kidneys are normal.  Bladder is normal. Stomach/Bowel: Normal. No sign of ileus, obstruction, inflammatory disease or other focal lesion. Normal appendix. Vascular/Lymphatic: Aortic atherosclerosis. No aneurysm. IVC is normal. Reproductive: Normal Other: No free fluid or air. Musculoskeletal: Chronic degenerative disease at the L5-S1 disc level. IMPRESSION: 1. Normal appearing pancreas. No evidence of acute or chronic inflammatory disease. 2. Aortic atherosclerosis. 3. 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 4. No abnormality seen to explain acute abdominal pain. Aortic Atherosclerosis (ICD10-I70.0).  Electronically Signed   By: Paulina Fusi M.D.   On: 08/24/2020 14:32    ____________________________________________   PROCEDURES and INTERVENTIONS  Procedure(s) performed (including Critical Care):  Procedures  Medications  alum & mag hydroxide-simeth (MAALOX/MYLANTA) 200-200-20 MG/5ML suspension 30 mL (30 mLs Oral Given 08/24/20 1316)    And  lidocaine (XYLOCAINE) 2 % viscous mouth solution 15 mL (15 mLs Oral Given 08/24/20 1317)  ondansetron (ZOFRAN-ODT) disintegrating tablet 4 mg (4 mg Oral Given 08/24/20 1317)  pantoprazole (PROTONIX) injection 40 mg (40 mg Intravenous Given 08/24/20 1339)  morphine 4 MG/ML injection 4 mg (4 mg Intravenous Given 08/24/20 1340)  lactated ringers bolus 1,000 mL (0 mLs Intravenous Stopped 08/24/20 1456)  iohexol (OMNIPAQUE) 300 MG/ML solution 100 mL (100 mLs Intravenous Contrast Given 08/24/20 1358)  droperidol (INAPSINE) 2.5 MG/ML injection 2.5 mg (2.5 mg Intravenous Given 08/24/20 1457)    ____________________________________________   MDM / ED COURSE   45 year old male with history of alcoholism presents to the ED with acute abdominal discomfort with evidence of mild pancreatitis, and ultimately amenable to outpatient management.  Normal vitals on room air.  Exam with periumbilical and epigastric tenderness without peritoneal features.  Benign lower abdomen.  He otherwise looks well without neurovascular deficits, signs of trauma, acute alcohol withdrawals or DTs.  Blood work with mild elevation of his lipase to 68, but no LFT abnormalities.  Due to his continued pain after GI cocktail, PPI morphine, CT abdomen/pelvis is obtained and demonstrates no evidence of intra-abdominal pathology such as peripancreatic pseudocyst.  Patient has resolution of symptoms after droperidol, subsequently tolerating p.o. intake and feels better.  Anticipate a degree of chronic pancreatitis due to his drinking and I advised alcohol cessation.  I see no barriers to outpatient  management at this time.  We discussed return precautions for the ED.  Patient medically stable for outpatient management.   Clinical Course as of 08/24/20 2034  Tue Aug 24, 2020  1543 Reassessed. Pt reports feeling better. Has tolerated PO intake. We discuss mild pancreatitis. We discuss cessation of heavy alcohol drinking and return precautions for the ED [DS]    Clinical Course User Index [DS] Delton Prairie, MD    ____________________________________________   FINAL CLINICAL IMPRESSION(S) / ED DIAGNOSES  Final diagnoses:  Alcohol-induced acute pancreatitis without infection or necrosis  Generalized abdominal pain     ED Discharge Orders         Ordered    famotidine (PEPCID) 20 MG tablet  2 times daily        08/24/20 1544    ondansetron (ZOFRAN ODT) 4 MG disintegrating tablet  Every 8 hours PRN        08/24/20 1544           Dylan Smith   Note:  This document was prepared using Conservation officer, historic buildings and may include unintentional dictation errors.   Delton Prairie, MD 08/24/20 2036

## 2020-08-24 NOTE — ED Notes (Signed)
See triage note, pt reports abdominal pain, emesis and dark stool. Pt hunched over in treatment room and states "if you bring me pepto it isn't going to work"

## 2020-08-24 NOTE — ED Triage Notes (Addendum)
Pt in with c/o generalized sharp, stabbing abdominal pain x 4-5 days. States some emesis, and reports about 4 episodes of black stool. Has been taking some Pepto, and does have hx of ulcers. No cp or sob reported. Does endorse drinking about 5 40oz beers/day, last drink 5 days ago

## 2020-12-16 ENCOUNTER — Emergency Department
Admission: EM | Admit: 2020-12-16 | Discharge: 2020-12-16 | Disposition: A | Discharge status: Home / Self Care | Payer: Self-pay | Attending: Emergency Medicine | Admitting: Emergency Medicine

## 2020-12-16 ENCOUNTER — Other Ambulatory Visit: Payer: Self-pay

## 2020-12-16 ENCOUNTER — Encounter: Payer: Self-pay | Admitting: *Deleted

## 2020-12-16 DIAGNOSIS — Z5321 Procedure and treatment not carried out due to patient leaving prior to being seen by health care provider: Secondary | ICD-10-CM | POA: Insufficient documentation

## 2020-12-16 DIAGNOSIS — M79675 Pain in left toe(s): Secondary | ICD-10-CM | POA: Insufficient documentation

## 2020-12-16 LAB — CBC
HCT: 48.2 % (ref 39.0–52.0)
Hemoglobin: 17.1 g/dL — ABNORMAL HIGH (ref 13.0–17.0)
MCH: 33.5 pg (ref 26.0–34.0)
MCHC: 35.5 g/dL (ref 30.0–36.0)
MCV: 94.3 fL (ref 80.0–100.0)
Platelets: 238 10*3/uL (ref 150–400)
RBC: 5.11 MIL/uL (ref 4.22–5.81)
RDW: 12.3 % (ref 11.5–15.5)
WBC: 13.1 10*3/uL — ABNORMAL HIGH (ref 4.0–10.5)
nRBC: 0 % (ref 0.0–0.2)

## 2020-12-16 LAB — BASIC METABOLIC PANEL
Anion gap: 10 (ref 5–15)
BUN: 6 mg/dL (ref 6–20)
CO2: 19 mmol/L — ABNORMAL LOW (ref 22–32)
Calcium: 7.9 mg/dL — ABNORMAL LOW (ref 8.9–10.3)
Chloride: 104 mmol/L (ref 98–111)
Creatinine, Ser: 0.87 mg/dL (ref 0.61–1.24)
GFR, Estimated: >60 mL/min (ref >60)
Glucose, Bld: 96 mg/dL (ref 70–99)
Potassium: 3.9 mmol/L (ref 3.5–5.1)
Sodium: 133 mmol/L — ABNORMAL LOW (ref 135–145)

## 2020-12-16 NOTE — ED Triage Notes (Signed)
EMS brings pt in for c/o left 3rd toe pain x wk; denies any injury

## 2020-12-16 NOTE — ED Triage Notes (Signed)
Pt brought in via ems from home.  Pt has discoloration of left 3rd toe for 1 week   pt reports pain in left foot.  No hx diabetes   pt alert speech clear.

## 2020-12-16 NOTE — ED Notes (Signed)
Pt in lobby approaches and states "Parker Thompson been here for 2 and a half hours and I just want to go home.  Just take me home".  Pyt asked if he could call someone for a ride and he said "no.  Just get me home".  Pt stated he came by EMS.  Explained to patient that the hospital doesn't provide for transportation home but I can call him a cab.  Pt states "are you gonna pay for it"?.  Pt on the hospital provided lobby phone screaming at someone about how hes been sitting there and we refuse to get him home.  Pt screaming  Multiple profanities and security was called.

## 2020-12-18 IMAGING — CR DG LUMBAR SPINE 2-3V
3 series · 3 of 3 positions shown · non-contrast
Comparison: 07/13/2017

CLINICAL DATA: 44-year-old male presenting with low back pain. No
history of trauma.

EXAM:
LUMBAR SPINE - 2-3 VIEW

[l-spine ap]
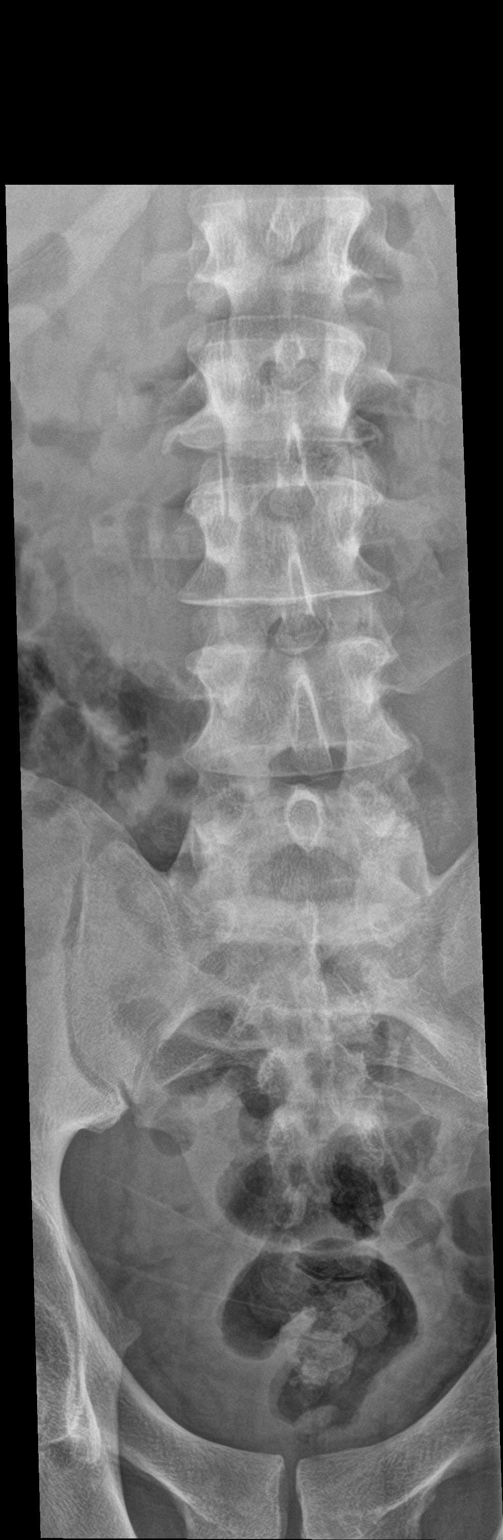

[l-spine lat]
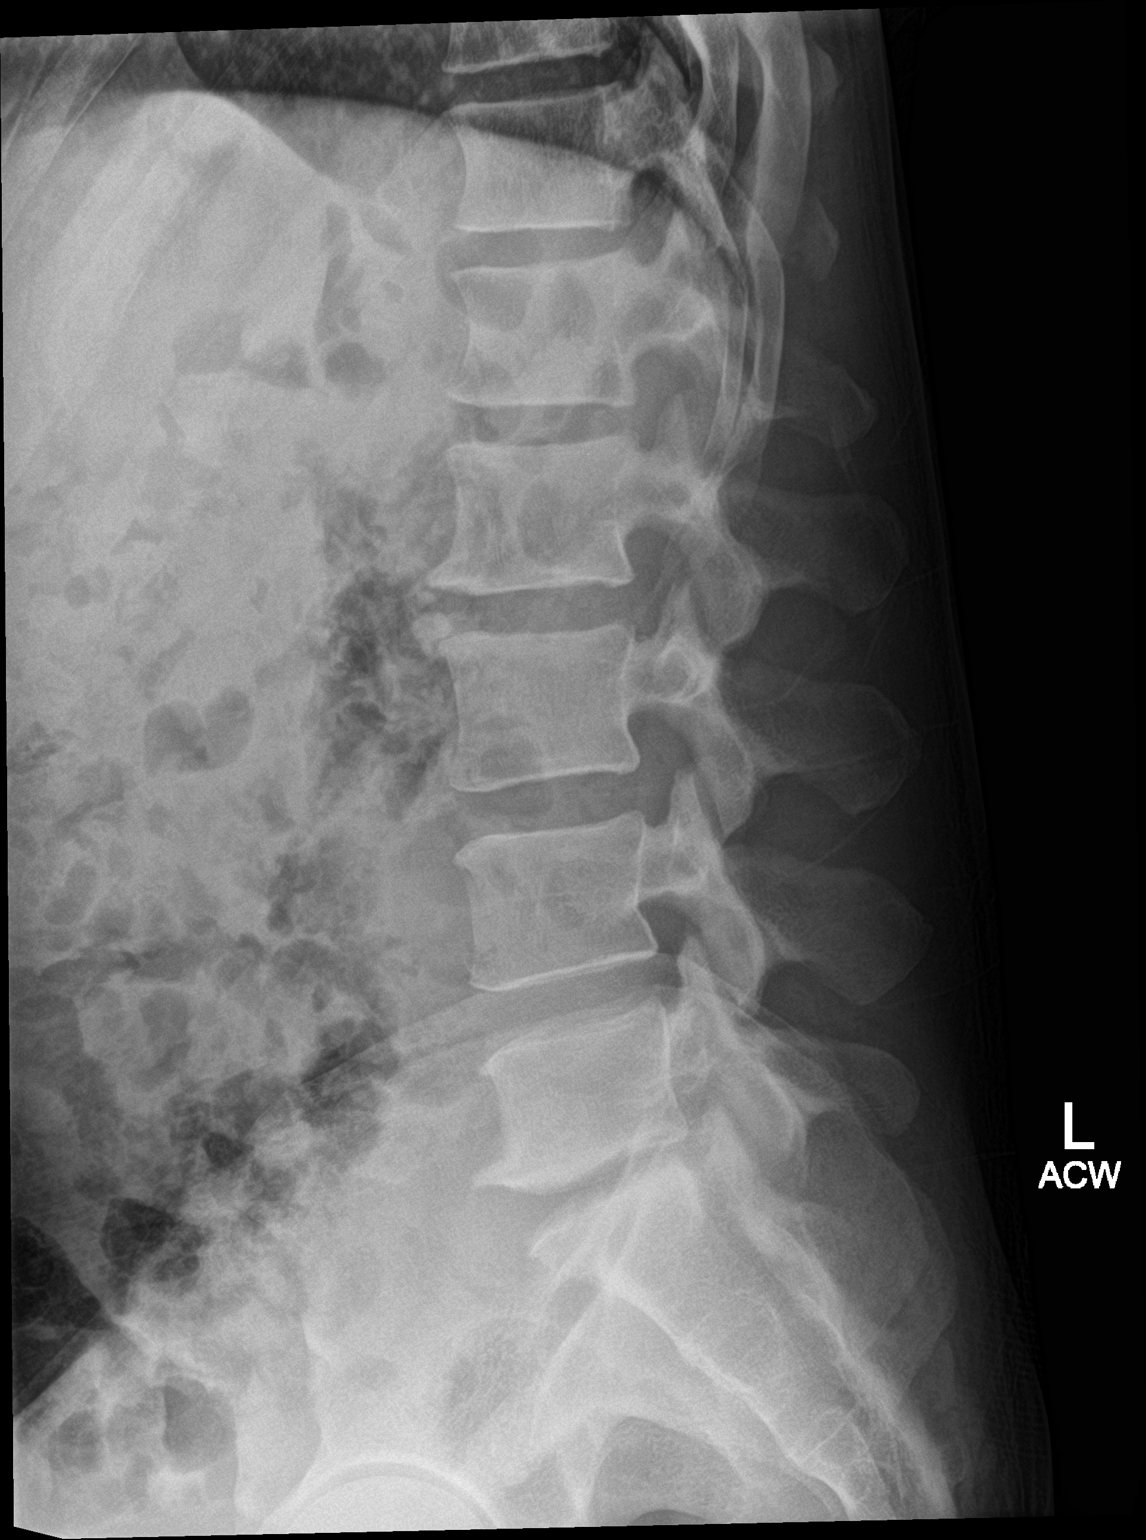

[l-spine spot]
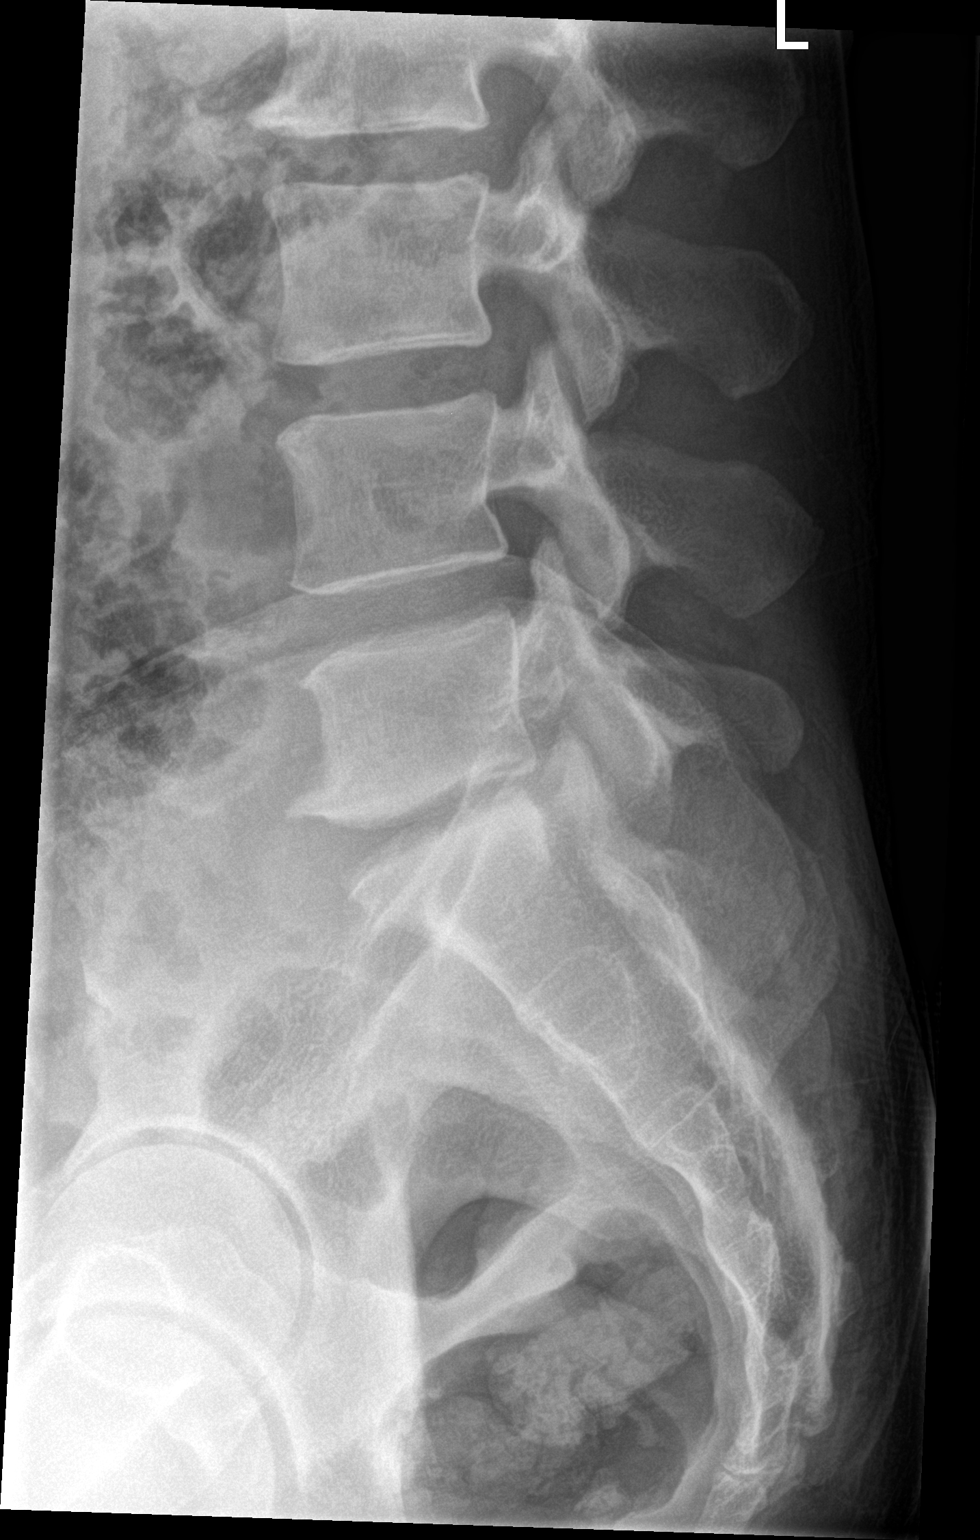

[3 of 3 positions shown; findings below may reference images not displayed]

FINDINGS: There is no evidence of lumbar spine fracture. Straightening of the
normal lumbar lordosis. There is minimal, unchanged retrolisthesis
of L5 on S1 with associated mild disc space height loss and
osteophyte formation at this level. Alignment is otherwise normal.
Intervertebral disc spaces are maintained.
IMPRESSION: No acute lumbar spine abnormality. Similar appearing mild discogenic
degenerative changes about L5-S1.

## 2022-01-09 ENCOUNTER — Encounter: Payer: Self-pay | Admitting: Emergency Medicine

## 2022-01-09 ENCOUNTER — Other Ambulatory Visit: Payer: Self-pay

## 2022-01-09 ENCOUNTER — Emergency Department
Admission: EM | Admit: 2022-01-09 | Discharge: 2022-01-10 | Disposition: A | Discharge status: Home / Self Care | Payer: No Typology Code available for payment source | Attending: Emergency Medicine | Admitting: Emergency Medicine

## 2022-01-09 DIAGNOSIS — F1010 Alcohol abuse, uncomplicated: Secondary | ICD-10-CM | POA: Diagnosis present

## 2022-01-09 DIAGNOSIS — Y900 Blood alcohol level of less than 20 mg/100 ml: Secondary | ICD-10-CM | POA: Insufficient documentation

## 2022-01-09 DIAGNOSIS — F1410 Cocaine abuse, uncomplicated: Secondary | ICD-10-CM | POA: Diagnosis not present

## 2022-01-09 DIAGNOSIS — Z20822 Contact with and (suspected) exposure to covid-19: Secondary | ICD-10-CM | POA: Insufficient documentation

## 2022-01-09 NOTE — ED Notes (Signed)
Pt given a snack 

## 2022-01-09 NOTE — ED Triage Notes (Signed)
Pt presents for detox for cocaine and etoh. Denies SI/HI, AH/VH. Would just like help for substance use. Pt polite, calm and cooperative.

## 2022-01-10 LAB — COMPREHENSIVE METABOLIC PANEL
ALT: 16 U/L (ref 0–44)
AST: 18 U/L (ref 15–41)
Albumin: 2.9 g/dL — ABNORMAL LOW (ref 3.5–5.0)
Alkaline Phosphatase: 86 U/L (ref 38–126)
Anion gap: 6 (ref 5–15)
BUN: 11 mg/dL (ref 6–20)
CO2: 23 mmol/L (ref 22–32)
Calcium: 8.4 mg/dL — ABNORMAL LOW (ref 8.9–10.3)
Chloride: 108 mmol/L (ref 98–111)
Creatinine, Ser: 0.91 mg/dL (ref 0.61–1.24)
GFR, Estimated: >60 mL/min (ref >60)
Glucose, Bld: 97 mg/dL (ref 70–99)
Potassium: 4 mmol/L (ref 3.5–5.1)
Sodium: 137 mmol/L (ref 135–145)
Total Bilirubin: <0.1 mg/dL — ABNORMAL LOW (ref 0.3–1.2)
Total Protein: 6.1 g/dL — ABNORMAL LOW (ref 6.5–8.1)

## 2022-01-10 LAB — URINE DRUG SCREEN, QUALITATIVE (ARMC ONLY)
Amphetamines, Ur Screen: NOT DETECTED
Barbiturates, Ur Screen: NOT DETECTED
Benzodiazepine, Ur Scrn: NOT DETECTED
Cannabinoid 50 Ng, Ur ~~LOC~~: NOT DETECTED
Cocaine Metabolite,Ur ~~LOC~~: POSITIVE — AB
MDMA (Ecstasy)Ur Screen: NOT DETECTED
Methadone Scn, Ur: NOT DETECTED
Opiate, Ur Screen: NOT DETECTED
Phencyclidine (PCP) Ur S: NOT DETECTED
Tricyclic, Ur Screen: NOT DETECTED

## 2022-01-10 LAB — CBC WITH DIFFERENTIAL/PLATELET
Abs Immature Granulocytes: 0.04 10*3/uL (ref 0.00–0.07)
Basophils Absolute: 0 10*3/uL (ref 0.0–0.1)
Basophils Relative: 1 %
Eosinophils Absolute: 0.2 10*3/uL (ref 0.0–0.5)
Eosinophils Relative: 2 %
HCT: 45 % (ref 39.0–52.0)
Hemoglobin: 15.2 g/dL (ref 13.0–17.0)
Immature Granulocytes: 1 %
Lymphocytes Relative: 30 %
Lymphs Abs: 2.4 10*3/uL (ref 0.7–4.0)
MCH: 31.4 pg (ref 26.0–34.0)
MCHC: 33.8 g/dL (ref 30.0–36.0)
MCV: 93 fL (ref 80.0–100.0)
Monocytes Absolute: 0.5 10*3/uL (ref 0.1–1.0)
Monocytes Relative: 7 %
Neutro Abs: 4.8 10*3/uL (ref 1.7–7.7)
Neutrophils Relative %: 59 %
Platelets: 308 10*3/uL (ref 150–400)
RBC: 4.84 MIL/uL (ref 4.22–5.81)
RDW: 13.6 % (ref 11.5–15.5)
WBC: 8 10*3/uL (ref 4.0–10.5)
nRBC: 0 % (ref 0.0–0.2)

## 2022-01-10 LAB — ETHANOL: Alcohol, Ethyl (B): <10 mg/dL (ref <10)

## 2022-01-10 LAB — RESP PANEL BY RT-PCR (FLU A&B, COVID) ARPGX2
Influenza A by PCR: NEGATIVE
Influenza B by PCR: NEGATIVE
SARS Coronavirus 2 by RT PCR: NEGATIVE

## 2022-01-10 LAB — ACETAMINOPHEN LEVEL: Acetaminophen (Tylenol), Serum: <10 ug/mL — ABNORMAL LOW (ref 10–30)

## 2022-01-10 LAB — SALICYLATE LEVEL: Salicylate Lvl: <7 mg/dL — ABNORMAL LOW (ref 7.0–30.0)

## 2022-01-10 NOTE — BH Assessment (Signed)
SAFE  TRANSPORT  CALLED  TO  TAKE PT  [facility]  INFORMED  BILL  RN

## 2022-01-10 NOTE — ED Notes (Signed)
VOL/Pending Detox Placement

## 2022-01-10 NOTE — ED Notes (Signed)
Pt given snack. 

## 2022-01-10 NOTE — BH Assessment (Signed)
TTS called [facility] and spoke to Intake RN Earlyne Iba) who confirmed bed availability for adult male. Patient has been accepted to [facility] Detoxification program. Patient can arrive anytime today.   Phone number: [phone] option #3   Address: [address] [place], [place] [address]

## 2022-01-10 NOTE — BH Assessment (Signed)
Comprehensive Clinical Assessment (CCA) Note  01/10/2022 Parker Thompson [phone] Recommendations for Services/Supports/Treatments: Pt. requested substance abuse treatment/detox. Pt to be referred out for placement.  Parker Thompson is a 46 year old, English speaking, Black male with no known PMH hx. Pt also has a hx of polysubstance abuse. Per pt.'s first note: Pt presents for detox for cocaine and etoh. Denies SI/HI, AH/VH. Would just like help for substance use.  Pt states "I'm tired. I need a life.". Pt explained that his life has become unmanageable as he is homeless, unemployed, and his substance use has increased. Pt reported that he uses cocaine and alcohol, daily. Pt reports that he uses cannabis every now and then. Pt denies having withdrawal sx.  Pt had a responsive affect and a depressed mood. Pt was cooperative throughout the interview. Pt was oriented x4. Pt's motor activity was normal and eye contact was good. Pt denied current SI/HIAV/H. Pt's UDS/BAL are pending.    Chief Complaint:  Chief Complaint  Patient presents with   detox   Visit Diagnosis: Polysubstance abuse    CCA Screening, Triage and Referral (STR)  Patient Reported Information How did you hear about [place]? Self  Referral name: No data recorded Referral phone number: No data recorded  Whom do you see for routine medical problems? No data recorded Practice/Facility Name: No data recorded Practice/Facility Phone Number: No data recorded Name of Contact: No data recorded Contact Number: No data recorded Contact Fax Number: No data recorded Prescriber Name: No data recorded Prescriber Address (if known): No data recorded  What Is the Reason for Your Visit/Call Today? No data recorded How Long Has This Been Causing You Problems? No data recorded What Do You Feel Would Help You the Most Today? No data recorded  Have You Recently Been in Any Inpatient Treatment (Hospital/Detox/Crisis Center/28-Day Program)? No  data recorded Name/Location of Program/Hospital:No data recorded How Long Were You There? No data recorded When Were You Discharged? No data recorded  Have You Ever Received Services From [facility] Before? No data recorded Who Do You See at [facility]? No data recorded  Have You Recently Had Any Thoughts About Hurting Yourself? No data recorded Are You Planning to Commit Suicide/Harm Yourself At This time? No data recorded  Have you Recently Had Thoughts About Hurting Someone Karolee Ohs? No data recorded Explanation: No data recorded  Have You Used Any Alcohol or Drugs in the Past 24 Hours? No data recorded How Long Ago Did You Use Drugs or Alcohol? No data recorded What Did You Use and How Much? No data recorded  Do You Currently Have a Therapist/Psychiatrist? No data recorded Name of Therapist/Psychiatrist: No data recorded  Have You Been Recently Discharged From Any Office Practice or Programs? No data recorded Explanation of Discharge From Practice/Program: No data recorded    CCA Screening Triage Referral Assessment Type of Contact: No data recorded Is this Initial or Reassessment? No data recorded Date Telepsych consult ordered in [facility]:  No data recorded Time Telepsych consult ordered in [facility]:  No data recorded  Patient Reported Information Reviewed? No data recorded Patient Left Without Being Seen? No data recorded Reason for Not Completing Assessment: No data recorded  Collateral Involvement: No data recorded  Does Patient Have a Court Appointed Legal Guardian? No data recorded Name and Contact of Legal Guardian: No data recorded If Minor and Not Living with Parent(s), Who has Custody? No data recorded Is CPS involved or ever been involved? No data recorded Is APS  involved or ever been involved? No data recorded  Patient Determined To Be At Risk for Harm To Self or Others Based on Review of Patient Reported Information or Presenting Complaint? No data recorded Method:  No data recorded Availability of Means: No data recorded Intent: No data recorded Notification Required: No data recorded Additional Information for Danger to Others Potential: No data recorded Additional Comments for Danger to Others Potential: No data recorded Are There Guns or Other Weapons in Your Home? No data recorded Types of Guns/Weapons: No data recorded Are These Weapons Safely Secured?                            No data recorded Who Could Verify You Are Able To Have These Secured: No data recorded Do You Have any Outstanding Charges, Pending Court Dates, Parole/Probation? No data recorded Contacted To Inform of Risk of Harm To Self or Others: No data recorded  Location of Assessment: No data recorded  Does Patient Present under Involuntary Commitment? No data recorded IVC Papers Initial File Date: No data recorded  [place] of Residence: No data recorded  Patient Currently Receiving the Following Services: No data recorded  Determination of Need: No data recorded  Options For Referral: No data recorded    CCA Biopsychosocial Intake/Chief Complaint:  No data recorded Current Symptoms/Problems: No data recorded  Patient Reported Schizophrenia/Schizoaffective Diagnosis in Past: No data recorded  Strengths: No data recorded Preferences: No data recorded Abilities: No data recorded  Type of Services Patient Feels are Needed: No data recorded  Initial Clinical Notes/Concerns: No data recorded  Mental Health Symptoms Depression:  No data recorded  Duration of Depressive symptoms: No data recorded  Mania:  No data recorded  Anxiety:   No data recorded  Psychosis:  No data recorded  Duration of Psychotic symptoms: No data recorded  Trauma:  No data recorded  Obsessions:  No data recorded  Compulsions:  No data recorded  Inattention:  No data recorded  Hyperactivity/Impulsivity:  No data recorded  Oppositional/Defiant Behaviors:  No data recorded  Emotional  Irregularity:  No data recorded  Other Mood/Personality Symptoms:  No data recorded   Mental Status Exam Appearance and self-care  Stature:  No data recorded  Weight:  No data recorded  Clothing:  No data recorded  Grooming:  No data recorded  Cosmetic use:  No data recorded  Posture/gait:  No data recorded  Motor activity:  No data recorded  Sensorium  Attention:  No data recorded  Concentration:  No data recorded  Orientation:  No data recorded  Recall/memory:  No data recorded  Affect and Mood  Affect:  No data recorded  Mood:  No data recorded  Relating  Eye contact:  No data recorded  Facial expression:  No data recorded  Attitude toward examiner:  No data recorded  Thought and Language  Speech flow: No data recorded  Thought content:  No data recorded  Preoccupation:  No data recorded  Hallucinations:  No data recorded  Organization:  No data recorded  [employer] of Knowledge:  No data recorded  Intelligence:  No data recorded  Abstraction:  No data recorded  Judgement:  No data recorded  Reality Testing:  No data recorded  Insight:  No data recorded  Decision Making:  No data recorded  Social Functioning  Social Maturity:  No data recorded  Social Judgement:  No data recorded  Stress  Stressors:  No data recorded  Coping Ability:  No data recorded  Skill Deficits:  No data recorded  Supports:  No data recorded    Religion:    Leisure/Recreation:    Exercise/Diet:     CCA Employment/Education Employment/Work Situation:    Education:     CCA Family/Childhood History Family and Relationship History:    Childhood History:     Child/Adolescent Assessment:     CCA Substance Use Alcohol/Drug Use:                           ASAM's:  Six Dimensions of Multidimensional Assessment  Dimension 1:  Acute Intoxication and/or Withdrawal Potential:      Dimension 2:  Biomedical Conditions and Complications:       Dimension 3:  Emotional, Behavioral, or Cognitive Conditions and Complications:     Dimension 4:  Readiness to Change:     Dimension 5:  Relapse, Continued use, or Continued Problem Potential:     Dimension 6:  Recovery/Living Environment:     ASAM Severity Score:    ASAM Recommended Level of Treatment:     Substance use Disorder (SUD)    Recommendations for Services/Supports/Treatments:    DSM5 Diagnoses: Patient Active Problem List   Diagnosis Date Noted   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

## 2022-01-10 NOTE — BH Assessment (Signed)
This Clinical research associate provided pt with substance abuse/detox resources with instructions to follow up to check on bed availability.

## 2022-01-10 NOTE — ED Provider Notes (Signed)
   [facility] Provider Note   Event Date/Time   First MD Initiated Contact with Patient 01/09/22 1825     (approximate) History  detox  HPI Parker Thompson is a 46 y.o. male with a stated past medical history of cocaine and alcohol abuse who presents requesting resources for detoxification.  Patient currently denies any suicidal ideation, homicidal ideation, or auditory/visual hallucinations and just states that he would like help for substance use. ROS: Patient currently denies any vision changes, tinnitus, difficulty speaking, facial droop, sore throat, chest pain, shortness of breath, abdominal pain, nausea/vomiting/diarrhea, dysuria, or weakness/numbness/paresthesias in any extremity   Physical Exam  Triage Vital Signs: ED Triage Vitals  Enc Vitals Group     BP 01/09/22 1724 113/82     Pulse Rate 01/09/22 1724 99     Resp 01/09/22 1724 16     Temp 01/09/22 1724 99.1 F (37.3 C)     Temp Source 01/09/22 1724 Oral     SpO2 01/09/22 1724 94 %     Weight --      Height --      Head Circumference --      Peak Flow --      Pain Score 01/09/22 1840 0     Pain Loc --      Pain Edu? --      Excl. in GC? --    Most recent vital signs: Vitals:   01/09/22 1724  BP: 113/82  Pulse: 99  Resp: 16  Temp: 99.1 F (37.3 C)  SpO2: 94%   General: Awake, oriented x4. CV:  Good peripheral perfusion.  Resp:  Normal effort.  Abd:  No distention.  Other:  Middle-aged African-American male sitting in chair in no acute distress ED Results / Procedures / Treatments   PROCEDURES: Critical Care performed: No Procedures MEDICATIONS ORDERED IN ED: Medications - No data to display IMPRESSION / MDM / ASSESSMENT AND PLAN / ED COURSE  I reviewed the triage vital signs and the nursing notes.                             Differential diagnosis includes, but is not limited to, alcohol withdrawal, cocaine use, alcohol intoxication The patient is on the cardiac monitor to  evaluate for evidence of arrhythmia and/or significant heart rate changes. Patient's presentation is most consistent with acute presentation with potential threat to life or bodily function. Patient is a 46 year old male who presents requesting resources for cocaine and alcohol detoxification.  Patient does not show any signs of active alcohol withdrawal at this time.  Patient admits to daily use with use just prior to arrival.  Transition of care team pending at time of signout.   FINAL CLINICAL IMPRESSION(S) / ED DIAGNOSES   Final diagnoses:  Cocaine abuse (HCC)  Alcohol abuse   Rx / DC Orders   ED Discharge Orders     None      Note:  This document was prepared using Dragon voice recognition software and may include unintentional dictation errors.   Merwyn Katos, MD 01/10/22 [phone]

## 2022-01-10 NOTE — BH Assessment (Addendum)
This Clinical research associate spoke with [facility] Staff who reported that they do have open beds available tomorrow.

## 2022-01-10 NOTE — ED Notes (Signed)
Spoke with Rosalita Chessman, [facility] regarding patient placement. Due to homelessness the plan is for pt to remain here while awaiting placement for detox and substance abuse.

## 2022-02-10 ENCOUNTER — Other Ambulatory Visit: Payer: Self-pay

## 2022-02-10 ENCOUNTER — Emergency Department
Admission: EM | Admit: 2022-02-10 | Discharge: 2022-02-11 | Disposition: A | Discharge status: Home / Self Care | Payer: No Typology Code available for payment source | Attending: Emergency Medicine | Admitting: Emergency Medicine

## 2022-02-10 DIAGNOSIS — F1410 Cocaine abuse, uncomplicated: Secondary | ICD-10-CM | POA: Diagnosis not present

## 2022-02-10 DIAGNOSIS — Z5900 Homelessness unspecified: Secondary | ICD-10-CM | POA: Diagnosis not present

## 2022-02-10 LAB — COMPREHENSIVE METABOLIC PANEL
ALT: 24 U/L (ref 0–44)
AST: 26 U/L (ref 15–41)
Albumin: 3.9 g/dL (ref 3.5–5.0)
Alkaline Phosphatase: 94 U/L (ref 38–126)
Anion gap: 7 (ref 5–15)
BUN: 16 mg/dL (ref 6–20)
CO2: 23 mmol/L (ref 22–32)
Calcium: 9.3 mg/dL (ref 8.9–10.3)
Chloride: 110 mmol/L (ref 98–111)
Creatinine, Ser: 0.87 mg/dL (ref 0.61–1.24)
GFR, Estimated: >60 mL/min (ref >60)
Glucose, Bld: 167 mg/dL — ABNORMAL HIGH (ref 70–99)
Potassium: 3.8 mmol/L (ref 3.5–5.1)
Sodium: 140 mmol/L (ref 135–145)
Total Bilirubin: 0.6 mg/dL (ref 0.3–1.2)
Total Protein: 7.6 g/dL (ref 6.5–8.1)

## 2022-02-10 LAB — CBC
HCT: 46.7 % (ref 39.0–52.0)
Hemoglobin: 15.9 g/dL (ref 13.0–17.0)
MCH: 31.2 pg (ref 26.0–34.0)
MCHC: 34 g/dL (ref 30.0–36.0)
MCV: 91.7 fL (ref 80.0–100.0)
Platelets: 282 10*3/uL (ref 150–400)
RBC: 5.09 MIL/uL (ref 4.22–5.81)
RDW: 13 % (ref 11.5–15.5)
WBC: 6.9 10*3/uL (ref 4.0–10.5)
nRBC: 0 % (ref 0.0–0.2)

## 2022-02-10 LAB — URINE DRUG SCREEN, QUALITATIVE (ARMC ONLY)
Amphetamines, Ur Screen: NOT DETECTED
Barbiturates, Ur Screen: NOT DETECTED
Benzodiazepine, Ur Scrn: POSITIVE — AB
Cannabinoid 50 Ng, Ur ~~LOC~~: NOT DETECTED
Cocaine Metabolite,Ur ~~LOC~~: NOT DETECTED
MDMA (Ecstasy)Ur Screen: NOT DETECTED
Methadone Scn, Ur: NOT DETECTED
Opiate, Ur Screen: NOT DETECTED
Phencyclidine (PCP) Ur S: NOT DETECTED
Tricyclic, Ur Screen: NOT DETECTED

## 2022-02-10 LAB — ETHANOL: Alcohol, Ethyl (B): <10 mg/dL (ref <10)

## 2022-02-10 LAB — ACETAMINOPHEN LEVEL: Acetaminophen (Tylenol), Serum: <10 ug/mL — ABNORMAL LOW (ref 10–30)

## 2022-02-10 LAB — SALICYLATE LEVEL: Salicylate Lvl: <7 mg/dL — ABNORMAL LOW (ref 7.0–30.0)

## 2022-02-10 NOTE — ED Notes (Signed)
Resting without distress or needs.

## 2022-02-10 NOTE — ED Provider Notes (Signed)
[facility] Provider Note   Event Date/Time   First MD Initiated Contact with Patient 02/10/22 1504     (approximate) History  Detox/Rehab  HPI Parker Thompson is a 46 y.o. male with a stated past medical history of homelessness, cocaine abuse, and left BKA who presents requesting resources for cocaine detoxification.  Patient states that he has been smoking crack for years now and wants to get off of it but feels that he needs to go to a rehab facility that is outside of [facility] due to the social environment causing relapse.  Patient states that he does not have any form of telecommunication or any vehicle to get him to an inpatient rehab facility and therefore presented to our emergency department.  Patient states last crack cocaine use was this morning ROS: Patient currently denies any vision changes, tinnitus, difficulty speaking, facial droop, sore throat, chest pain, shortness of breath, abdominal pain, nausea/vomiting/diarrhea, dysuria, or weakness/numbness/paresthesias in any extremity   Physical Exam  Triage Vital Signs: ED Triage Vitals  Enc Vitals Group     BP 02/10/22 1118 125/79     Pulse Rate 02/10/22 1118 (!) 109     Resp 02/10/22 1118 18     Temp 02/10/22 1118 98.1 F (36.7 C)     Temp Source 02/10/22 1118 Oral     SpO2 02/10/22 1118 100 %     Weight 02/10/22 1119 165 lb (74.8 kg)     Height 02/10/22 1119 5\' 9"  (1.753 m)     Head Circumference --      Peak Flow --      Pain Score 02/10/22 1118 10     Pain Loc --      Pain Edu? --      Excl. in GC? --    Most recent vital signs: Vitals:   02/10/22 1118 02/10/22 1300  BP: 125/79 (!) 109/96  Pulse: (!) 109 90  Resp: 18 17  Temp: 98.1 F (36.7 C) 97.8 F (36.6 C)  SpO2: 100% 96%   General: Awake, oriented x4. CV:  Good peripheral perfusion.  Resp:  Normal effort.  Abd:  No distention.  Other:  Middle-aged African-American male laying in bed in no acute distress, eating crackers  and applesauce, left BKA ED Results / Procedures / Treatments  Labs (all labs ordered are listed, but only abnormal results are displayed) Labs Reviewed  COMPREHENSIVE METABOLIC PANEL - Abnormal; Notable for the following components:      Result Value   Glucose, Bld 167 (*)    All other components within normal limits  SALICYLATE LEVEL - Abnormal; Notable for the following components:   Salicylate Lvl <7.0 (*)    All other components within normal limits  ACETAMINOPHEN LEVEL - Abnormal; Notable for the following components:   Acetaminophen (Tylenol), Serum <10 (*)    All other components within normal limits  URINE DRUG SCREEN, QUALITATIVE ([facility] ONLY) - Abnormal; Notable for the following components:   Benzodiazepine, Ur Scrn POSITIVE (*)    All other components within normal limits  ETHANOL  CBC   PROCEDURES: Critical Care performed: No Procedures MEDICATIONS ORDERED IN ED: Medications - No data to display IMPRESSION / MDM / ASSESSMENT AND PLAN / ED COURSE  I reviewed the triage vital signs and the nursing notes.  The patient is on the cardiac monitor to evaluate for evidence of arrhythmia and/or significant heart rate changes. Patient's presentation is most consistent with acute presentation with potential threat to life or bodily function. Patient is a 46 year old male with the above-stated past medical history who presents for resources for detoxification.  I spoke to on-call social work who stated that they could likely get patient into an inpatient rehab facility by tomorrow morning and recommended him staying in our emergency department so that he can be placed.  Patient has no new complaints at this time.  Patient pending placement  Care of this patient will be signed out to the oncoming physician at the end of my shift.  All pertinent patient information conveyed and all questions answered.  All further care and disposition decisions will be made  by the oncoming physician.    FINAL CLINICAL IMPRESSION(S) / ED DIAGNOSES   Final diagnoses:  Cocaine abuse (HCC)   Rx / DC Orders   ED Discharge Orders     None      Note:  This document was prepared using Dragon voice recognition software and may include unintentional dictation errors.   Merwyn Katos, MD 02/10/22 [phone]

## 2022-02-10 NOTE — ED Triage Notes (Signed)
Pt presents to ED wanting help to get off crack cocaine. Pt states he has been smoking crack for 20 years, pt states he wants to be sent somewhere out of [place] due to "the crowd". Pt states he if goes to [facility] then he will walk out due to it being in [place].   Pt states he did smoke crack this morning.   Pt denies SI or HI. Pt is A&Ox4 at this time.

## 2022-02-10 NOTE — ED Notes (Signed)
Patient belongings - white tank, black jeans, brown belt, 1 tennis shore, gray hat, red shirt, gray back pack all labeled with patient sticker to label.  Patient keeping his prosthetic leg and cane for balance and to remain ambulatory.

## 2022-02-10 NOTE — ED Notes (Signed)
Vol pending possible admit to [facility]

## 2022-02-10 NOTE — TOC Initial Note (Signed)
Transition of Care [facility]) - Initial/Assessment Note    Patient Details  Name: Parker Thompson MRN: [id number] Date of Birth: 12-02-1975  [facility]) CM/SW Contact:    Allayne Butcher, RN Phone Number: 02/10/2022, 3:52 PM  Clinical Narrative:                 Patient came into the emergency room today requesting help for detox from cocaine.  Patient would like to go outside of [place].  Referral faxed to [facility], they said they potentially have one male bed available.  They will review the referral, they ask that if we have not heard from them in half hour to call them back.          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), [place] - [address] [place]) [place] [address] Phone: [phone] Fax: [phone]     Social Determinants of Health (SDOH) Interventions    Readmission Risk Interventions     No data to display

## 2022-02-10 NOTE — ED Notes (Addendum)
Pt given refreshments per request- no lunch boxes available at this time. Pt states he has not eaten in a few days. Pt is AOX4, ambulatory with artificial leg and cane, NAD noted.

## 2022-02-10 NOTE — TOC Progression Note (Signed)
Transition of Care [facility]) - Progression Note    Patient Details  Name: Parker Thompson MRN: [id number] Date of Birth: [birth year]  Transition of Care [facility]) CM/SW Contact  Allayne Butcher, RN Phone Number: 02/10/2022, 4:31 PM  Clinical Narrative:    TTS will cont to assist with detox for patient.  TOC signing off.         Expected Discharge Plan and Services                                                 Social Determinants of Health (SDOH) Interventions    Readmission Risk Interventions     No data to display

## 2022-02-10 NOTE — ED Notes (Signed)
Introduced self to patient. He denies ETOH abuse- seeking treatment for cocaine abuse. Denies SI. Has prosthetic to BKA on left leg. Reports lost leg due to blood clot.

## 2022-02-10 NOTE — BH Assessment (Signed)
Spoke with [facility] [phone]) and have a possible bed for the patient but will need following information about his current medications and he have them with him, have his prosthesis, crutches, his long-term plan for SA treatment (ex. [employer]) and reported ulcer when he was at their facility 01/18/2022.  Spoke with the patient he states she no longer take the blood thinner, don't' have his Lipitor and may have the Cymbalta, he has no knowledge about the ulcer [facility] was referring to. He has his prosthesis and cane.  Called [facility] and spoke with ([phone]), she apologized but was unaware the bed they thought they would have had for the patient, was already reserved for another patient. Was advised to call tomorrow for bed availability.

## 2022-02-10 NOTE — ED Notes (Signed)
Coke given. No needs. Continues to rest without distress.

## 2022-02-10 NOTE — ED Notes (Signed)
Assumed care of patient.

## 2022-02-11 NOTE — ED Notes (Signed)
Pt given breakfast.

## 2022-02-11 NOTE — ED Notes (Signed)
Pt is currently taking a shower. Shower supplies given no other needs found a this moment.

## 2022-02-11 NOTE — ED Provider Notes (Signed)
-----------------------------------------   2:13 PM on 02/11/2022 -----------------------------------------  Plan was initially to try to get this patient to [facility], however when they were last called her no beds available currently.  He has no acute medical issues and is stable for discharge.  The referral information for [facility] as well as for other substance abuse resources have been provided.   Dionne Bucy, MD 02/11/22 1413

## 2022-02-11 NOTE — ED Notes (Signed)
LUNCH TRAY GIVEN. 

## 2022-02-11 NOTE — ED Provider Notes (Signed)
-----------------------------------------   5:40 AM on 02/11/2022 -----------------------------------------   Blood pressure 116/71, pulse 85, temperature 97.9 F (36.6 C), resp. rate 16, height 5\' 9"  (1.753 m), weight 74.8 kg, SpO2 93 %.  The patient is calm and cooperative at this time.  There have been no acute events since the last update.  Awaiting disposition plan from Behavioral Medicine for rehab facility, likely [facility].   , MD 02/11/22 [phone]

## 2022-02-11 NOTE — BH Assessment (Signed)
Spoke with patient about not being able not being able to locate a detox bed and that he would be discharging. Patient stated he understood.

## 2022-09-11 DIAGNOSIS — M792 Neuralgia and neuritis, unspecified: Secondary | ICD-10-CM | POA: Diagnosis not present

## 2022-10-25 DIAGNOSIS — Z823 Family history of stroke: Secondary | ICD-10-CM | POA: Diagnosis not present

## 2022-10-25 DIAGNOSIS — J301 Allergic rhinitis due to pollen: Secondary | ICD-10-CM | POA: Diagnosis not present

## 2022-10-25 DIAGNOSIS — I10 Essential (primary) hypertension: Secondary | ICD-10-CM | POA: Diagnosis not present

## 2022-10-25 DIAGNOSIS — F1421 Cocaine dependence, in remission: Secondary | ICD-10-CM | POA: Diagnosis not present

## 2022-10-25 DIAGNOSIS — Z89512 Acquired absence of left leg below knee: Secondary | ICD-10-CM | POA: Diagnosis not present

## 2022-10-25 DIAGNOSIS — Z809 Family history of malignant neoplasm, unspecified: Secondary | ICD-10-CM | POA: Diagnosis not present

## 2022-10-25 DIAGNOSIS — G629 Polyneuropathy, unspecified: Secondary | ICD-10-CM | POA: Diagnosis not present

## 2022-10-25 DIAGNOSIS — F419 Anxiety disorder, unspecified: Secondary | ICD-10-CM | POA: Diagnosis not present

## 2022-10-25 DIAGNOSIS — E785 Hyperlipidemia, unspecified: Secondary | ICD-10-CM | POA: Diagnosis not present

## 2022-10-25 DIAGNOSIS — F329 Major depressive disorder, single episode, unspecified: Secondary | ICD-10-CM | POA: Diagnosis not present

## 2022-10-25 DIAGNOSIS — F17210 Nicotine dependence, cigarettes, uncomplicated: Secondary | ICD-10-CM | POA: Diagnosis not present

## 2022-10-25 DIAGNOSIS — Z91013 Allergy to seafood: Secondary | ICD-10-CM | POA: Diagnosis not present

## 2022-11-09 ENCOUNTER — Emergency Department
Admission: EM | Admit: 2022-11-09 | Discharge: 2022-11-09 | Disposition: A | Discharge status: Home / Self Care | Payer: 59 | Attending: Emergency Medicine | Admitting: Emergency Medicine

## 2022-11-09 ENCOUNTER — Other Ambulatory Visit: Payer: Self-pay

## 2022-11-09 ENCOUNTER — Emergency Department: Payer: 59

## 2022-11-09 DIAGNOSIS — R0602 Shortness of breath: Secondary | ICD-10-CM | POA: Diagnosis not present

## 2022-11-09 DIAGNOSIS — Z20822 Contact with and (suspected) exposure to covid-19: Secondary | ICD-10-CM | POA: Diagnosis not present

## 2022-11-09 DIAGNOSIS — J40 Bronchitis, not specified as acute or chronic: Secondary | ICD-10-CM | POA: Diagnosis not present

## 2022-11-09 DIAGNOSIS — B349 Viral infection, unspecified: Secondary | ICD-10-CM | POA: Diagnosis not present

## 2022-11-09 LAB — CBC WITH DIFFERENTIAL/PLATELET
Abs Immature Granulocytes: 0.03 10*3/uL (ref 0.00–0.07)
Basophils Absolute: 0.1 10*3/uL (ref 0.0–0.1)
Basophils Relative: 1 %
Eosinophils Absolute: 0.5 10*3/uL (ref 0.0–0.5)
Eosinophils Relative: 5 %
HCT: 43.4 % (ref 39.0–52.0)
Hemoglobin: 14.3 g/dL (ref 13.0–17.0)
Immature Granulocytes: 0 %
Lymphocytes Relative: 11 %
Lymphs Abs: 1 10*3/uL (ref 0.7–4.0)
MCH: 29.6 pg (ref 26.0–34.0)
MCHC: 32.9 g/dL (ref 30.0–36.0)
MCV: 89.9 fL (ref 80.0–100.0)
Monocytes Absolute: 0.6 10*3/uL (ref 0.1–1.0)
Monocytes Relative: 6 %
Neutro Abs: 7.1 10*3/uL (ref 1.7–7.7)
Neutrophils Relative %: 77 %
Platelets: 253 10*3/uL (ref 150–400)
RBC: 4.83 MIL/uL (ref 4.22–5.81)
RDW: 13.5 % (ref 11.5–15.5)
WBC: 9.2 10*3/uL (ref 4.0–10.5)
nRBC: 0 % (ref 0.0–0.2)

## 2022-11-09 LAB — COMPREHENSIVE METABOLIC PANEL
ALT: 24 U/L (ref 0–44)
AST: 20 U/L (ref 15–41)
Albumin: 3.1 g/dL — ABNORMAL LOW (ref 3.5–5.0)
Alkaline Phosphatase: 95 U/L (ref 38–126)
Anion gap: 5 (ref 5–15)
BUN: 8 mg/dL (ref 6–20)
CO2: 25 mmol/L (ref 22–32)
Calcium: 8 mg/dL — ABNORMAL LOW (ref 8.9–10.3)
Chloride: 109 mmol/L (ref 98–111)
Creatinine, Ser: 0.89 mg/dL (ref 0.61–1.24)
GFR, Estimated: >60 mL/min (ref >60)
Glucose, Bld: 79 mg/dL (ref 70–99)
Potassium: 3.8 mmol/L (ref 3.5–5.1)
Sodium: 139 mmol/L (ref 135–145)
Total Bilirubin: 0.6 mg/dL (ref 0.3–1.2)
Total Protein: 6 g/dL — ABNORMAL LOW (ref 6.5–8.1)

## 2022-11-09 LAB — SARS CORONAVIRUS 2 BY RT PCR: SARS Coronavirus 2 by RT PCR: NEGATIVE

## 2022-11-09 MED ORDER — METHYLPREDNISOLONE SODIUM SUCC 125 MG IJ SOLR
125.0000 mg | Freq: Once | INTRAMUSCULAR | Status: AC
  Administered 2022-11-09: 125 mg via INTRAMUSCULAR
  Filled 2022-11-09: qty 2

## 2022-11-09 MED ORDER — ALBUTEROL SULFATE HFA 108 (90 BASE) MCG/ACT IN AERS: 2.0000 | INHALATION_SPRAY | Freq: Four times a day (QID) | RESPIRATORY_TRACT | 0 refills | Status: DC | PRN

## 2022-11-09 MED ORDER — IPRATROPIUM-ALBUTEROL 0.5-2.5 (3) MG/3ML IN SOLN
6.0000 mL | Freq: Once | RESPIRATORY_TRACT | Status: AC
  Administered 2022-11-09: 6 mL via RESPIRATORY_TRACT
  Filled 2022-11-09: qty 6

## 2022-11-09 MED ORDER — PREDNISONE 50 MG PO TABS: 50.0000 mg | ORAL_TABLET | Freq: Every day | ORAL | 0 refills | Status: AC

## 2022-11-09 MED ORDER — BENZONATATE 100 MG PO CAPS: 100.0000 mg | ORAL_CAPSULE | Freq: Three times a day (TID) | ORAL | 0 refills | Status: AC | PRN

## 2022-11-09 MED ORDER — ALBUTEROL SULFATE HFA 108 (90 BASE) MCG/ACT IN AERS: 2.0000 | INHALATION_SPRAY | Freq: Four times a day (QID) | RESPIRATORY_TRACT | 0 refills | Status: AC | PRN

## 2022-11-09 MED ORDER — BENZONATATE 100 MG PO CAPS: 100.0000 mg | ORAL_CAPSULE | Freq: Three times a day (TID) | ORAL | 0 refills | Status: DC | PRN

## 2022-11-09 MED ORDER — ALBUTEROL SULFATE (2.5 MG/3ML) 0.083% IN NEBU
5.0000 mg | INHALATION_SOLUTION | Freq: Once | RESPIRATORY_TRACT | Status: AC
  Administered 2022-11-09: 5 mg via RESPIRATORY_TRACT
  Filled 2022-11-09: qty 6

## 2022-11-09 MED ORDER — PREDNISONE 50 MG PO TABS: 50.0000 mg | ORAL_TABLET | Freq: Every day | ORAL | 0 refills | Status: DC

## 2022-11-09 NOTE — ED Notes (Signed)
Pt given sandwich tray and drink- Christiane Ha PA approved. No other needs voiced at this time.

## 2022-11-09 NOTE — ED Notes (Signed)
Ambulatory to restroom. SHOB with exertion. Sats maintained 92-94% same as when seated.

## 2022-11-09 NOTE — ED Provider Notes (Signed)
[facility] Provider Note  Patient Contact: 4:16 PM (approximate)   History   Shortness of Breath   HPI  Parker Thompson is a 47 y.o. male who presents the emergency department complaining of shortness of breath, cough.  Patient states that he has had a cold x 2 days.  No vision changes, chest pain, shortness of breath, abdominal pain, nausea vomiting, diarrhea or constipation.  Patient no respiratory or cardiac history.  Does have a history of GI ulcer, pancreatitis, alcohol abuse.     Physical Exam   Triage Vital Signs: ED Triage Vitals  Enc Vitals Group     BP 11/09/22 1525 (!) 158/93     Pulse Rate 11/09/22 1525 (!) 109     Resp 11/09/22 1525 (!) 22     Temp 11/09/22 1525 98.7 F (37.1 C)     Temp Source 11/09/22 1525 Oral     SpO2 11/09/22 1525 92 %     Weight 11/09/22 1529 180 lb (81.6 kg)     Height --      Head Circumference --      Peak Flow --      Pain Score 11/09/22 1528 10     Pain Loc --      Pain Edu? --      Excl. in GC? --     Most recent vital signs: Vitals:   11/09/22 1525 11/09/22 1916  BP: (!) 158/93 (!) 145/67  Pulse: (!) 109 (!) 125  Resp: (!) 22 (!) 22  Temp: 98.7 F (37.1 C) 98.9 F (37.2 C)  SpO2: 92% 94%     General: Alert and in no acute distress. ENT:      Ears:       Nose: No congestion/rhinnorhea.      Mouth/Throat: Mucous membranes are moist. Neck: No stridor. No cervical spine tenderness to palpation. Hematological/Lymphatic/Immunilogical: No cervical lymphadenopathy. Cardiovascular:  Good peripheral perfusion Respiratory: Normal respiratory effort without tachypnea or retractions. Lungs with expiratory wheezing bilaterally.  No rales or rhonchi.Peri Jefferson air entry to the bases with no decreased or absent breath sounds. Musculoskeletal: Full range of motion to all extremities.  Neurologic:  No gross focal neurologic deficits are appreciated.  Skin:   No rash noted Other:   ED Results / Procedures /  Treatments   Labs (all labs ordered are listed, but only abnormal results are displayed) Labs Reviewed  COMPREHENSIVE METABOLIC PANEL - Abnormal; Notable for the following components:      Result Value   Calcium 8.0 (*)    Total Protein 6.0 (*)    Albumin 3.1 (*)    All other components within normal limits  SARS CORONAVIRUS 2 BY RT PCR  CBC WITH DIFFERENTIAL/PLATELET     EKG     RADIOLOGY  I personally viewed, evaluated, and interpreted these images as part of my medical decision making, as well as reviewing the written report by the radiologist.  ED Provider Interpretation: No acute cardiopulmonary findings  DG Chest 2 View  Result Date: 11/09/2022 CLINICAL DATA:  Shortness of breath. EXAM: CHEST - 2 VIEW COMPARISON:  06/26/2018 FINDINGS: The heart size and mediastinal contours are within normal limits. There is no evidence of pulmonary edema, consolidation, pneumothorax or pleural fluid. Vague symmetric nodular densities bilaterally overlying the lower lungs likely correspond to bilateral nipple shadows. The visualized skeletal structures are unremarkable. IMPRESSION: No acute findings. Probable bilateral nipple shadows. Electronically Signed   By: Rudene Anda.D.  On: 11/09/2022 15:59    PROCEDURES:  Critical Care performed: No  Procedures   MEDICATIONS ORDERED IN ED: Medications  albuterol (PROVENTIL) (2.5 MG/3ML) 0.083% nebulizer solution 5 mg (5 mg Nebulization Given 11/09/22 1644)  methylPREDNISolone sodium succinate (SOLU-MEDROL) 125 mg/2 mL injection 125 mg (125 mg Intramuscular Given 11/09/22 1643)  ipratropium-albuterol (DUONEB) 0.5-2.5 (3) MG/3ML nebulizer solution 6 mL (6 mLs Nebulization Given 11/09/22 1854)     IMPRESSION / MDM / ASSESSMENT AND PLAN / ED COURSE  I reviewed the triage vital signs and the nursing notes.                              Clinical Course as of 11/09/22 2026  Thu Nov 09, 2022  1750 Patient arrives with report shortness of  breath, cough, viral symptoms.  Patient did have some expiratory wheezing bilaterally.  Patient has received Solu-Medrol shot and albuterol treatment.  Patient has improved in regards to auscultation though there is some residual wheezing.  At this time we will repeat breathing treatment, while steroid time to work and reassess. [JC]    Clinical Course User Index [JC] Dyanne Yorks, Delorise Royals, PA-C    Differential diagnosis includes, but is not limited to, viral illness, COVID, flu, pneumonia, bronchitis   Patient's presentation is most consistent with acute presentation with potential threat to life or bodily function.   Patient's diagnosis is consistent with viral illness with bronchitis.  Patient presents emergency department with viral symptoms for the last 2 days.  He had increased shortness of breath and did arrive with wheezing.  No history of asthma.  Patient received breathing treatments and shot of steroid and feels much improved at this time.  Lung sounds are now clear.  He is already on over-the-counter medications for some of his viral symptoms but I will prescribe prednisone and albuterol.  Follow-up with primary care as needed.  Return precautions discussed with the patient..  Patient is given ED precautions to return to the ED for any worsening or new symptoms.     FINAL CLINICAL IMPRESSION(S) / ED DIAGNOSES   Final diagnoses:  Bronchitis  Viral illness     Rx / DC Orders   ED Discharge Orders          Ordered    albuterol (VENTOLIN HFA) 108 (90 Base) MCG/ACT inhaler  Every 6 hours PRN        11/09/22 2026    predniSONE (DELTASONE) 50 MG tablet  Daily with breakfast        11/09/22 2026    benzonatate (TESSALON PERLES) 100 MG capsule  3 times daily PRN        11/09/22 2026             Note:  This document was prepared using Dragon voice recognition software and may include unintentional dictation errors.   Lanette Hampshire 11/09/22 2027     Chesley Noon, MD 11/10/22 [phone]

## 2022-11-09 NOTE — ED Triage Notes (Signed)
Pt presents to the ED due to SOB that started yesterday. Pt states he had a cold that started 2 days ago. Pt states he took Nyquil last night. Pt completing sentences. Pt A&Ox4

## 2022-11-09 NOTE — ED Notes (Signed)
Pt A&O x4, no obvious distress noted, respirations regular/unlabored. Pt verbalizes understanding of discharge instructions. Pt able to ambulate from ED independently.
